# Patient Record
Sex: Female | Born: 1990 | Race: White | Hispanic: No | Marital: Single | Smoking: Current every day smoker
Health system: Southern US, Community
[De-identification: ages and names within clinical notes are randomized; demographics above are authoritative.]

## PROBLEM LIST (undated history)

## (undated) DIAGNOSIS — F32A Depression, unspecified: Secondary | ICD-10-CM

## (undated) DIAGNOSIS — F329 Major depressive disorder, single episode, unspecified: Secondary | ICD-10-CM

## (undated) DIAGNOSIS — R569 Unspecified convulsions: Secondary | ICD-10-CM

## (undated) DIAGNOSIS — F419 Anxiety disorder, unspecified: Secondary | ICD-10-CM

---
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
## 2010-05-04 ENCOUNTER — Inpatient Hospital Stay: Payer: Self-pay | Admitting: Obstetrics and Gynecology

## 2013-05-19 ENCOUNTER — Emergency Department: Payer: Self-pay | Admitting: Emergency Medicine

## 2013-05-19 LAB — URINALYSIS, COMPLETE
Bacteria: NONE SEEN
Blood: NEGATIVE
Ph: 8 (ref 4.5–8.0)
Protein: NEGATIVE
Squamous Epithelial: 3
WBC UR: 2 /HPF (ref 0–5)

## 2013-05-19 LAB — BASIC METABOLIC PANEL
Anion Gap: 9 (ref 7–16)
BUN: 8 mg/dL (ref 7–18)
Co2: 25 mmol/L (ref 21–32)
Creatinine: 0.61 mg/dL (ref 0.60–1.30)
Glucose: 83 mg/dL (ref 65–99)
Osmolality: 277 (ref 275–301)

## 2013-05-19 LAB — WET PREP, GENITAL

## 2013-05-19 LAB — CBC
MCH: 32.6 pg (ref 26.0–34.0)
MCHC: 34.6 g/dL (ref 32.0–36.0)
Platelet: 338 10*3/uL (ref 150–440)
RBC: 4.03 10*6/uL (ref 3.80–5.20)
RDW: 12.7 % (ref 11.5–14.5)
WBC: 15.4 10*3/uL — ABNORMAL HIGH (ref 3.6–11.0)

## 2013-05-19 LAB — GC/CHLAMYDIA PROBE AMP

## 2014-07-31 ENCOUNTER — Emergency Department: Payer: Self-pay | Admitting: Emergency Medicine

## 2014-08-01 LAB — COMPREHENSIVE METABOLIC PANEL
AST: 26 U/L (ref 15–37)
Albumin: 3.6 g/dL (ref 3.4–5.0)
Alkaline Phosphatase: 91 U/L
Anion Gap: 7 (ref 7–16)
BUN: 10 mg/dL (ref 7–18)
Bilirubin,Total: 0.4 mg/dL (ref 0.2–1.0)
CALCIUM: 9.3 mg/dL (ref 8.5–10.1)
CHLORIDE: 108 mmol/L — AB (ref 98–107)
CO2: 23 mmol/L (ref 21–32)
CREATININE: 0.61 mg/dL (ref 0.60–1.30)
EGFR (African American): >60
EGFR (Non-African Amer.): >60
Glucose: 73 mg/dL (ref 65–99)
Osmolality: 273 (ref 275–301)
Potassium: 4.3 mmol/L (ref 3.5–5.1)
SGPT (ALT): 22 U/L
Sodium: 138 mmol/L (ref 136–145)
TOTAL PROTEIN: 8.2 g/dL (ref 6.4–8.2)

## 2014-08-01 LAB — CBC
HCT: 43.4 % (ref 35.0–47.0)
HGB: 14.3 g/dL (ref 12.0–16.0)
MCH: 32 pg (ref 26.0–34.0)
MCHC: 33 g/dL (ref 32.0–36.0)
MCV: 97 fL (ref 80–100)
Platelet: 571 10*3/uL — ABNORMAL HIGH (ref 150–440)
RBC: 4.47 10*6/uL (ref 3.80–5.20)
RDW: 14.1 % (ref 11.5–14.5)
WBC: 14.9 10*3/uL — ABNORMAL HIGH (ref 3.6–11.0)

## 2015-03-16 ENCOUNTER — Encounter: Payer: Self-pay | Admitting: Emergency Medicine

## 2015-03-16 ENCOUNTER — Emergency Department
Admission: EM | Admit: 2015-03-16 | Discharge: 2015-03-16 | Disposition: A | Discharge status: Home / Self Care | Payer: Self-pay | Attending: Emergency Medicine | Admitting: Emergency Medicine

## 2015-03-16 DIAGNOSIS — Z880 Allergy status to penicillin: Secondary | ICD-10-CM | POA: Insufficient documentation

## 2015-03-16 DIAGNOSIS — Z720 Tobacco use: Secondary | ICD-10-CM | POA: Insufficient documentation

## 2015-03-16 DIAGNOSIS — N946 Dysmenorrhea, unspecified: Secondary | ICD-10-CM | POA: Insufficient documentation

## 2015-03-16 DIAGNOSIS — Z3202 Encounter for pregnancy test, result negative: Secondary | ICD-10-CM | POA: Insufficient documentation

## 2015-03-16 LAB — POCT PREGNANCY, URINE: Preg Test, Ur: NEGATIVE

## 2015-03-16 LAB — WET PREP, GENITAL
Clue Cells Wet Prep HPF POC: NEGATIVE — AB
TRICH WET PREP: NEGATIVE — AB
YEAST WET PREP: NEGATIVE — AB

## 2015-03-16 LAB — CHLAMYDIA/NGC RT PCR (ARMC ONLY)
Chlamydia Tr: NOT DETECTED
N gonorrhoeae: NOT DETECTED

## 2015-03-16 NOTE — Discharge Instructions (Signed)
Return to the emergency department for any new or worsening vaginal bleeding, dizziness, passing out, abdominal pain, or fever.  Dysmenorrhea Menstrual cramps (dysmenorrhea) are caused by the muscles of the uterus tightening (contracting) during a menstrual period. For some women, this discomfort is merely bothersome. For others, dysmenorrhea can be severe enough to interfere with everyday activities for a few days each month. Primary dysmenorrhea is menstrual cramps that last a couple of days when you start having menstrual periods or soon after. This often begins after a teenager starts having her period. As a woman gets older or has a baby, the cramps will usually lessen or disappear. Secondary dysmenorrhea begins later in life, lasts longer, and the pain may be stronger than primary dysmenorrhea. The pain may start before the period and last a few days after the period.  CAUSES  Dysmenorrhea is usually caused by an underlying problem, such as:  The tissue lining the uterus grows outside of the uterus in other areas of the body (endometriosis).  The endometrial tissue, which normally lines the uterus, is found in or grows into the muscular walls of the uterus (adenomyosis).  The pelvic blood vessels are engorged with blood just before the menstrual period (pelvic congestive syndrome).  Overgrowth of cells (polyps) in the lining of the uterus or cervix.  Falling down of the uterus (prolapse) because of loose or stretched ligaments.  Depression.  Bladder problems, infection, or inflammation.  Problems with the intestine, a tumor, or irritable bowel syndrome.  Cancer of the female organs or bladder.  A severely tipped uterus.  A very tight opening or closed cervix.  Noncancerous tumors of the uterus (fibroids).  Pelvic inflammatory disease (PID).  Pelvic scarring (adhesions) from a previous surgery.  Ovarian cyst.  An intrauterine device (IUD) used for birth control. RISK  FACTORS You may be at greater risk of dysmenorrhea if:  You are younger than age 24.  You started puberty early.  You have irregular or heavy bleeding.  You have never given birth.  You have a family history of this problem.  You are a smoker. SIGNS AND SYMPTOMS   Cramping or throbbing pain in your lower abdomen.  Headaches.  Lower back pain.  Nausea or vomiting.  Diarrhea.  Sweating or dizziness.  Loose stools. DIAGNOSIS  A diagnosis is based on your history, symptoms, physical exam, diagnostic tests, or procedures. Diagnostic tests or procedures may include:  Blood tests.  Ultrasonography.  An examination of the lining of the uterus (dilation and curettage, D&C).  An examination inside your abdomen or pelvis with a scope (laparoscopy).  X-rays.  CT scan.  MRI.  An examination inside the bladder with a scope (cystoscopy).  An examination inside the intestine or stomach with a scope (colonoscopy, gastroscopy). TREATMENT  Treatment depends on the cause of the dysmenorrhea. Treatment may include:  Pain medicine prescribed by your health care provider.  Birth control pills or an IUD with progesterone hormone in it.  Hormone replacement therapy.  Nonsteroidal anti-inflammatory drugs (NSAIDs). These may help stop the production of prostaglandins.  Surgery to remove adhesions, endometriosis, ovarian cyst, or fibroids.  Removal of the uterus (hysterectomy).  Progesterone shots to stop the menstrual period.  Cutting the nerves on the sacrum that go to the female organs (presacral neurectomy).  Electric current to the sacral nerves (sacral nerve stimulation).  Antidepressant medicine.  Psychiatric therapy, counseling, or group therapy.  Exercise and physical therapy.  Meditation and yoga therapy.  Acupuncture. HOME CARE INSTRUCTIONS  Only take over-the-counter or prescription medicines as directed by your health care provider.  Place a  heating pad or hot water bottle on your lower back or abdomen. Do not sleep with the heating pad.  Use aerobic exercises, walking, swimming, biking, and other exercises to help lessen the cramping.  Massage to the lower back or abdomen may help.  Stop smoking.  Avoid alcohol and caffeine. SEEK MEDICAL CARE IF:   Your pain does not get better with medicine.  You have pain with sexual intercourse.  Your pain increases and is not controlled with medicines.  You have abnormal vaginal bleeding with your period.  You develop nausea or vomiting with your period that is not controlled with medicine. SEEK IMMEDIATE MEDICAL CARE IF:  You pass out.  Document Released: 10/22/2005 Document Revised: 06/24/2013 Document Reviewed: 04/09/2013 [facility] Patient Information 2015 [place], [place]. This information is not intended to replace advice given to you by your health care provider. Make sure you discuss any questions you have with your health care provider.

## 2015-03-16 NOTE — ED Provider Notes (Signed)
[facility] Emergency Department Provider Note   ____________________________________________  Time seen: 1:15 PM  I have reviewed the triage vital signs and the nursing notes.   HISTORY  Chief Complaint Vaginal Bleeding    HPI Dana Bradshaw is a 24 y.o. female who came for evaluation for vaginal bleeding for a few days. Yesterday it was pretty heavy and she passed a large clot. She had been 3 weeks late for her period raising possibility of pregnancy. Patient is also concerned a possible STD given the fact that she feels like she has a discharge with an odor and she knows that her boyfriend cheated on her. She has been treated for STD in the past this was "a long time ago. "She is not not having active vaginal bleeding today. She does not have dizziness or lightheadedness. She has not had any passing out. She has not had any abdominal pain.      History reviewed. No pertinent past medical history.  There are no active problems to display for this patient.   History reviewed. No pertinent past surgical history.  No current outpatient prescriptions on file.  Allergies Penicillins  No family history on file.  Social History History  Substance Use Topics  . Smoking status: Current Every Day Smoker  . Smokeless tobacco: Not on file  . Alcohol Use: Yes    Review of Systems  Constitutional: Negative for fever. Eyes: Negative for visual changes. ENT: Positive for seasonal allergies Cardiovascular: Negative for chest pain. Respiratory: Negative for shortness of breath. Gastrointestinal: Negative for abdominal pain, vomiting and diarrhea. Genitourinary: Negative for dysuria. Musculoskeletal: Negative for back pain. Skin: Negative for rash. Neurological: Negative for headaches, focal weakness or numbness.   10-point ROS otherwise negative.  ____________________________________________   PHYSICAL EXAM:  VITAL SIGNS: ED Triage Vitals   Enc Vitals Group     BP 03/16/15 1002 131/73 mmHg     Pulse Rate 03/16/15 1002 90     Resp 03/16/15 1002 18     Temp 03/16/15 1002 98.3 F (36.8 C)     Temp Source 03/16/15 1002 Oral     SpO2 03/16/15 1002 100 %     Weight 03/16/15 1002 132 lb (59.875 kg)     Height 03/16/15 1002 5\' 2"  (1.575 m)     Head Cir --      Peak Flow --      Pain Score 03/16/15 1003 5     Pain Loc --      Pain Edu? --      Excl. in GC? --      Constitutional: Alert and oriented. Well appearing and in no distress. Eyes: Conjunctivae are normal. Disconjugate gaze. Normal extraocular movements. ENT   Head: Normocephalic and atraumatic.   Nose: No congestion/rhinnorhea.   Mouth/Throat: Mucous membranes are moist.   Neck: No stridor. Cardiovascular: Normal rate, regular rhythm.  No murmurs, rubs, or gallops. Respiratory: Normal respiratory effort without tachypnea nor retractions. Breath sounds are clear and equal bilaterally. No wheezes/rales/rhonchi. Gastrointestinal: Soft and nontender. No distention.  Genitourinary: No obvious vaginal discharge. No vaginal bleeding. Nontender uterus and adnexa. No cervical motion tenderness Musculoskeletal: Nontender with normal range of motion in all extremities. No joint effusions.  No lower extremity tenderness nor edema. Neurologic:  Normal speech and language. No gross focal neurologic deficits are appreciated. Speech is normal. No gait instability. Skin:  Skin is warm, dry and intact. No rash noted. Psychiatric: Mood and affect are normal. Speech  and behavior are normal. Patient exhibits appropriate insight and judgment.  ____________________________________________   EKG    ____________________________________________   LABS (pertinent positives/negatives) Pregnancy test negative   ____________________________________________    RADIOLOGY    ____________________________________________   PROCEDURES  Procedure(s) performed:  None Critical Care performed:  No  ____________________________________________   INITIAL IMPRESSION / ASSESSMENT AND PLAN / ED COURSE  Pertinent labs & imaging results that were available during my care of the patient were reviewed by me and considered in my medical decision making (see chart for details).  I informed patient that her pregnancy test is negative. She is requesting to be checked for STD although she does not have any significant symptoms on exam and no cervical motion tenderness. She denies any dizziness or abdominal cramping. I do not see the utility in any laboratory blood work at this point time. She does follow with OB/GYN. We discussed that we did not do a Pap smear and she needs to continue to have her routine pelvic exam with OB/GYN. Current Chlamydia test were not back prior to discharge. Patient will be called only if positive. Return precautions and discharge instructions were provided to the patient.  ____________________________________________   FINAL CLINICAL IMPRESSION(S) / ED DIAGNOSES  dysmenorrhea    Governor Rooksebecca Zolton Dowson, MD 03/17/15 1539

## 2015-03-16 NOTE — ED Notes (Signed)
Pt states "i think i was pregnant bc my period was 3 weeks late but yesterday passed a big blood clot"  Light bleeding now. Also states "i want to be checked for STDs"

## 2015-03-16 NOTE — ED Notes (Signed)
Pt here d/t having abd pain earlier and vaginal bleeding.  Pt denies any pain currently.  Pt does state she passes a "large clot" earlier.

## 2015-03-16 NOTE — ED Notes (Signed)
Patient is resting comfortably. 

## 2016-08-22 ENCOUNTER — Encounter: Payer: Self-pay | Admitting: Medical Oncology

## 2016-08-22 ENCOUNTER — Emergency Department
Admission: EM | Admit: 2016-08-22 | Discharge: 2016-08-23 | Disposition: A | Discharge status: Home / Self Care | Payer: No Typology Code available for payment source | Attending: Student in an Organized Health Care Education/Training Program | Admitting: Student in an Organized Health Care Education/Training Program

## 2016-08-22 ENCOUNTER — Emergency Department
Admission: EM | Admit: 2016-08-22 | Discharge: 2016-08-22 | Disposition: A | Discharge status: Home / Self Care | Payer: Self-pay | Attending: Emergency Medicine | Admitting: Emergency Medicine

## 2016-08-22 ENCOUNTER — Emergency Department: Payer: No Typology Code available for payment source

## 2016-08-22 DIAGNOSIS — R4182 Altered mental status, unspecified: Secondary | ICD-10-CM | POA: Insufficient documentation

## 2016-08-22 DIAGNOSIS — F17200 Nicotine dependence, unspecified, uncomplicated: Secondary | ICD-10-CM | POA: Insufficient documentation

## 2016-08-22 DIAGNOSIS — Z79899 Other long term (current) drug therapy: Secondary | ICD-10-CM | POA: Insufficient documentation

## 2016-08-22 DIAGNOSIS — F15950 Other stimulant use, unspecified with stimulant-induced psychotic disorder with delusions: Secondary | ICD-10-CM

## 2016-08-22 DIAGNOSIS — F319 Bipolar disorder, unspecified: Secondary | ICD-10-CM | POA: Insufficient documentation

## 2016-08-22 DIAGNOSIS — F1910 Other psychoactive substance abuse, uncomplicated: Secondary | ICD-10-CM

## 2016-08-22 DIAGNOSIS — N3090 Cystitis, unspecified without hematuria: Secondary | ICD-10-CM | POA: Insufficient documentation

## 2016-08-22 DIAGNOSIS — R4689 Other symptoms and signs involving appearance and behavior: Secondary | ICD-10-CM

## 2016-08-22 DIAGNOSIS — R000 Tachycardia, unspecified: Secondary | ICD-10-CM

## 2016-08-22 DIAGNOSIS — F23 Brief psychotic disorder: Secondary | ICD-10-CM

## 2016-08-22 DIAGNOSIS — F1920 Other psychoactive substance dependence, uncomplicated: Secondary | ICD-10-CM

## 2016-08-22 HISTORY — DX: Major depressive disorder, single episode, unspecified: F32.9

## 2016-08-22 HISTORY — DX: Depression, unspecified: F32.A

## 2016-08-22 HISTORY — DX: Anxiety disorder, unspecified: F41.9

## 2016-08-22 HISTORY — DX: Unspecified convulsions: R56.9

## 2016-08-22 LAB — CBC WITH DIFFERENTIAL/PLATELET
BASOS ABS: 0.1 10*3/uL (ref 0–0.1)
BASOS PCT: 1 %
Basophils Absolute: 0.2 10*3/uL — ABNORMAL HIGH (ref 0–0.1)
Basophils Relative: 0 %
EOS PCT: 1 %
Eosinophils Absolute: 0.1 10*3/uL (ref 0–0.7)
Eosinophils Absolute: 0.1 10*3/uL (ref 0–0.7)
Eosinophils Relative: 0 %
HCT: 42.7 % (ref 35.0–47.0)
HEMATOCRIT: 36.3 % (ref 35.0–47.0)
HEMOGLOBIN: 12.5 g/dL (ref 12.0–16.0)
Hemoglobin: 14.5 g/dL (ref 12.0–16.0)
LYMPHS ABS: 3.6 10*3/uL (ref 1.0–3.6)
LYMPHS ABS: 2.9 10*3/uL (ref 1.0–3.6)
Lymphocytes Relative: 17 %
Lymphocytes Relative: 19 %
MCH: 32.9 pg (ref 26.0–34.0)
MCH: 33.6 pg (ref 26.0–34.0)
MCHC: 34 g/dL (ref 32.0–36.0)
MCHC: 34.5 g/dL (ref 32.0–36.0)
MCV: 96.7 fL (ref 80.0–100.0)
MCV: 97.4 fL (ref 80.0–100.0)
MONOS PCT: 8 %
Monocytes Absolute: 2 10*3/uL — ABNORMAL HIGH (ref 0.2–0.9)
Monocytes Absolute: 1.2 10*3/uL — ABNORMAL HIGH (ref 0.2–0.9)
Monocytes Relative: 9 %
NEUTROS ABS: 10.9 10*3/uL — AB (ref 1.4–6.5)
NEUTROS PCT: 72 %
Neutro Abs: 15.4 10*3/uL — ABNORMAL HIGH (ref 1.4–6.5)
Neutrophils Relative %: 73 %
PLATELETS: 402 10*3/uL (ref 150–440)
Platelets: 339 10*3/uL (ref 150–440)
RBC: 4.42 MIL/uL (ref 3.80–5.20)
RBC: 3.73 MIL/uL — AB (ref 3.80–5.20)
RDW: 13.3 % (ref 11.5–14.5)
RDW: 13.2 % (ref 11.5–14.5)
WBC: 21.2 10*3/uL — ABNORMAL HIGH (ref 3.6–11.0)
WBC: 15.2 10*3/uL — ABNORMAL HIGH (ref 3.6–11.0)

## 2016-08-22 LAB — COMPREHENSIVE METABOLIC PANEL
ALBUMIN: 3.6 g/dL (ref 3.5–5.0)
ALK PHOS: 54 U/L (ref 38–126)
ALT: 18 U/L (ref 14–54)
ALT: 18 U/L (ref 14–54)
ANION GAP: 7 (ref 5–15)
AST: 31 U/L (ref 15–41)
AST: 37 U/L (ref 15–41)
Albumin: 4.3 g/dL (ref 3.5–5.0)
Alkaline Phosphatase: 70 U/L (ref 38–126)
Anion gap: 10 (ref 5–15)
BILIRUBIN TOTAL: 1.5 mg/dL — AB (ref 0.3–1.2)
BUN: 14 mg/dL (ref 6–20)
BUN: 14 mg/dL (ref 6–20)
CALCIUM: 8.6 mg/dL — AB (ref 8.9–10.3)
CO2: 24 mmol/L (ref 22–32)
CO2: 23 mmol/L (ref 22–32)
CREATININE: 0.8 mg/dL (ref 0.44–1.00)
Calcium: 9.6 mg/dL (ref 8.9–10.3)
Chloride: 107 mmol/L (ref 101–111)
Chloride: 112 mmol/L — ABNORMAL HIGH (ref 101–111)
Creatinine, Ser: 1.06 mg/dL — ABNORMAL HIGH (ref 0.44–1.00)
GFR calc Af Amer: >60 mL/min (ref >60)
GFR calc Af Amer: >60 mL/min (ref >60)
GFR calc non Af Amer: >60 mL/min (ref >60)
GFR calc non Af Amer: >60 mL/min (ref >60)
GLUCOSE: 78 mg/dL (ref 65–99)
Glucose, Bld: 97 mg/dL (ref 65–99)
POTASSIUM: 3.9 mmol/L (ref 3.5–5.1)
Potassium: 3.5 mmol/L (ref 3.5–5.1)
Sodium: 141 mmol/L (ref 135–145)
Sodium: 142 mmol/L (ref 135–145)
TOTAL PROTEIN: 7.9 g/dL (ref 6.5–8.1)
TOTAL PROTEIN: 6.5 g/dL (ref 6.5–8.1)
Total Bilirubin: 1.5 mg/dL — ABNORMAL HIGH (ref 0.3–1.2)

## 2016-08-22 LAB — URINE DRUG SCREEN, QUALITATIVE (ARMC ONLY)
AMPHETAMINES, UR SCREEN: POSITIVE — AB
BENZODIAZEPINE, UR SCRN: NOT DETECTED
Barbiturates, Ur Screen: NOT DETECTED
COCAINE METABOLITE, UR ~~LOC~~: POSITIVE — AB
Cannabinoid 50 Ng, Ur ~~LOC~~: POSITIVE — AB
MDMA (Ecstasy)Ur Screen: NOT DETECTED
METHADONE SCREEN, URINE: NOT DETECTED
Opiate, Ur Screen: NOT DETECTED
Phencyclidine (PCP) Ur S: NOT DETECTED
TRICYCLIC, UR SCREEN: NOT DETECTED

## 2016-08-22 LAB — URINALYSIS COMPLETE WITH MICROSCOPIC (ARMC ONLY)
Bilirubin Urine: NEGATIVE
GLUCOSE, UA: NEGATIVE mg/dL
Nitrite: NEGATIVE
Protein, ur: 100 mg/dL — AB
Specific Gravity, Urine: 1.029 (ref 1.005–1.030)
pH: 5 (ref 5.0–8.0)

## 2016-08-22 LAB — TSH: TSH: 1.332 u[IU]/mL (ref 0.350–4.500)

## 2016-08-22 LAB — ETHANOL: Alcohol, Ethyl (B): <5 mg/dL (ref <5)

## 2016-08-22 LAB — POCT PREGNANCY, URINE: Preg Test, Ur: NEGATIVE

## 2016-08-22 LAB — SALICYLATE LEVEL

## 2016-08-22 LAB — ACETAMINOPHEN LEVEL: Acetaminophen (Tylenol), Serum: <10 ug/mL — ABNORMAL LOW (ref 10–30)

## 2016-08-22 LAB — LIPASE, BLOOD: Lipase: 17 U/L (ref 11–51)

## 2016-08-22 MED ORDER — DIPHENHYDRAMINE HCL 50 MG/ML IJ SOLN
25.0000 mg | Freq: Once | INTRAMUSCULAR | Status: AC
  Administered 2016-08-22: 25 mg via INTRAVENOUS
  Filled 2016-08-22: qty 1

## 2016-08-22 MED ORDER — CEFTRIAXONE SODIUM-DEXTROSE 1-3.74 GM-% IV SOLR: 1.0000 g | Freq: Once | INTRAVENOUS | Status: DC

## 2016-08-22 MED ORDER — LORAZEPAM 2 MG/ML IJ SOLN
1.0000 mg | Freq: Once | INTRAMUSCULAR | Status: AC
  Administered 2016-08-22: 1 mg via INTRAVENOUS

## 2016-08-22 MED ORDER — CIPROFLOXACIN HCL 500 MG PO TABS
500.0000 mg | ORAL_TABLET | ORAL | Status: AC
  Administered 2016-08-22: 500 mg via ORAL
  Filled 2016-08-22: qty 1

## 2016-08-22 MED ORDER — LORAZEPAM 2 MG/ML IJ SOLN
1.0000 mg | Freq: Once | INTRAMUSCULAR | Status: AC
  Administered 2016-08-22: 1 mg via INTRAVENOUS
  Filled 2016-08-22: qty 1

## 2016-08-22 MED ORDER — DEXTROSE 5 % IV SOLN: 1.0000 g | Freq: Once | INTRAVENOUS | Status: DC

## 2016-08-22 MED ORDER — CIPROFLOXACIN HCL 500 MG PO TABS: 500.0000 mg | ORAL_TABLET | Freq: Two times a day (BID) | ORAL | 0 refills | Status: DC

## 2016-08-22 MED ORDER — CEFTRIAXONE SODIUM-DEXTROSE 1-3.74 GM-% IV SOLR
1.0000 g | Freq: Once | INTRAVENOUS | Status: AC
  Administered 2016-08-22: 1 g via INTRAVENOUS
  Filled 2016-08-22: qty 50

## 2016-08-22 MED ORDER — ZIPRASIDONE MESYLATE 20 MG IM SOLR
20.0000 mg | Freq: Once | INTRAMUSCULAR | Status: AC
  Administered 2016-08-22: 20 mg via INTRAMUSCULAR

## 2016-08-22 MED ORDER — SODIUM CHLORIDE 0.9 % IV BOLUS (SEPSIS)
1000.0000 mL | Freq: Once | INTRAVENOUS | Status: AC
  Administered 2016-08-22: 1000 mL via INTRAVENOUS

## 2016-08-22 MED ORDER — DIPHENHYDRAMINE HCL 50 MG/ML IJ SOLN
50.0000 mg | Freq: Once | INTRAMUSCULAR | Status: AC
  Administered 2016-08-22: 50 mg via INTRAVENOUS
  Filled 2016-08-22: qty 1

## 2016-08-22 MED ORDER — LORAZEPAM 2 MG/ML IJ SOLN
2.0000 mg | Freq: Once | INTRAMUSCULAR | Status: AC
  Administered 2016-08-22: 2 mg via INTRAMUSCULAR

## 2016-08-22 NOTE — ED Notes (Signed)
Pt speaking with mother on the phone.
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
## 2016-08-22 NOTE — ED Notes (Signed)
BEHAVIORAL HEALTH ROUNDING Patient sleeping: No. Patient alert and oriented: yes Behavior appropriate: Yes.  ; If no, describe:  Nutrition and fluids offered: yes Toileting and hygiene offered: Yes  Sitter present: q15 minute observations and security  monitoring Law enforcement present: Yes  ODS  

## 2016-08-22 NOTE — ED Notes (Signed)
PT IVC PENDING CONSULT  
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
## 2016-08-22 NOTE — ED Notes (Signed)
Pt continues to have dystonic movement .. MD aware.. Will continue to monitor the pt.
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
## 2016-08-22 NOTE — Progress Notes (Signed)
Please contact mother Luster LandsbergRenee at [phone] with any updates or treatment recommendations once pt. Has been seen by Psych MD. Mother is has P.O.A.  Sheilia Reznick K. Sherlon HandingHarris, LCAS-A, LPC-A, [facility]  Counselor 08/22/2016 5:06 PM
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
## 2016-08-22 NOTE — ED Notes (Signed)
BEHAVIORAL HEALTH ROUNDING Patient sleeping: No. Patient alert and oriented: yes Behavior appropriate: Yes.  ; If no, describe:  Nutrition and fluids offered: yes Toileting and hygiene offered: Yes  Sitter present: q15 minute observations and security monitoring Law enforcement present: Yes  ODS  ENVIRONMENTAL ASSESSMENT Potentially harmful objects out of patient reach: Yes.   Personal belongings secured: Yes.   Patient dressed in hospital provided attire only: Yes.   Plastic bags out of patient reach: Yes.   Patient care equipment (cords, cables, call bells, lines, and drains) shortened, removed, or accounted for: Yes.  Pt is on monitor  Cords will be monitored closely   Equipment and supplies removed from bottom of stretcher: Yes.   Potentially toxic materials out of patient reach: Yes.   Sharps container removed or out of patient reach: Yes.

## 2016-08-22 NOTE — ED Notes (Signed)
Pt rest of pt clothing (pants/socks/boots) removed, pt attempted to hide a crack pipe when removing her boots.. MD notified and crack pipe given to the [employer] officer..Marland Kitchen

## 2016-08-22 NOTE — ED Notes (Signed)
Called pt mother Luster LandsbergRenee at [phone] so that pt could speak with her, pt then hands the phone to me and says to tell her mother what is going on .Marland Kitchen. Informed mother of pt current status, mother states she is POA and is going to fax over the paper work.

## 2016-08-22 NOTE — ED Notes (Signed)
Pt has pulled off pulse Ox, and has started to pull gauze wrap off from Iv site.. Pt reminded to not pull on her IV..will continue to monitor the pt.
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
## 2016-08-23 DIAGNOSIS — F15950 Other stimulant use, unspecified with stimulant-induced psychotic disorder with delusions: Secondary | ICD-10-CM

## 2016-08-23 DIAGNOSIS — F1920 Other psychoactive substance dependence, uncomplicated: Secondary | ICD-10-CM

## 2016-08-23 LAB — URINE CULTURE

## 2016-08-23 MED ORDER — DIPHENHYDRAMINE HCL 12.5 MG/5ML PO ELIX
25.0000 mg | ORAL_SOLUTION | Freq: Once | ORAL | Status: AC
  Administered 2016-08-23: 25 mg via ORAL

## 2016-08-23 MED ORDER — METHYLPREDNISOLONE SODIUM SUCC 125 MG IJ SOLR
125.0000 mg | Freq: Once | INTRAMUSCULAR | Status: AC
  Administered 2016-08-23: 125 mg via INTRAVENOUS

## 2016-08-23 MED ORDER — DIPHENHYDRAMINE HCL 50 MG/ML IJ SOLN
INTRAMUSCULAR | Status: AC
  Filled 2016-08-23: qty 1

## 2016-08-23 MED ORDER — METHYLPREDNISOLONE SODIUM SUCC 125 MG IJ SOLR
INTRAMUSCULAR | Status: AC
  Administered 2016-08-23: 125 mg via INTRAVENOUS
  Filled 2016-08-23: qty 2

## 2016-08-23 NOTE — ED Notes (Signed)
Pt taken off the monitor to allow her to go to the bathroom. Pt is noted to be calm and cooperative at this time. Will continue to monitor for further patient needs.

## 2016-08-23 NOTE — ED Notes (Signed)
This RN escorted patient to lobby to meet her father. Pt states at that point, "Well I will probably go smoke a cigarette and then turn around and come right back". First RN notified, charge RN notified.

## 2016-08-23 NOTE — ED Notes (Signed)
PT given phone for phone call from her mom at this time.

## 2016-08-23 NOTE — ED Notes (Signed)
Pts mother/POA called and requested to talk w/ Clappacs regarding pts discharge.  Pts mother was able to give pts password.  Pts mother stated that she did not believe pt should be discharged.  While pts mother was talking w/ this RN, Clappacs paged.  He answered page and spoke w/ this RN, while pts mother was placed on hold.  He reported that pt had asked that mother/POA not to be consulted regarding pt care plan.  This RN confirmed request w/ pt.  Pt stated that she did not want Clappacs to speak w/ POA/mother.  This RN informed pts mother/POA and offered to take phone number if pt changed her mind.  Pts mother declined to leave phone number, stated "but you can tell DR. Clappacs that she's going to be dead within a week, I guarantee it".   This RN informed [name], pts RN.

## 2016-08-23 NOTE — ED Notes (Signed)
Report received from Butch, RN 
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
## 2016-08-23 NOTE — ED Notes (Signed)
Dr. Clapacs at bedside at this time.  

## 2016-08-23 NOTE — Consult Note (Signed)
[facility] Consult   Reason for Consult:  Consult for 25 year old woman who was brought into the emergency room yesterday extremely agitated confused bizarre and aggressive behavior. Referring Physician:  Corky Downs Patient Identification: Dana Bradshaw MRN:  [id number] Principal Diagnosis: Amphetamine and psychostimulant-induced psychosis with delusions [facility]) Diagnosis:   Patient Active Problem List   Diagnosis Date Noted  . Amphetamine and psychostimulant-induced psychosis with delusions ([place]) [F15.950] 08/23/2016  . Polysubstance dependence [facility]) [F19.20] 08/23/2016    Total Time spent with patient: 1 hour  Subjective:   Dana Bradshaw is a 25 y.o. female patient admitted with "I got thrown out of an [facility] when I relapsed".  HPI:  Patient came to the emergency room yesterday morning initially complaining of some physical symptoms. She also identified substance abuse issues. She was to be discharged from the emergency room when apparently she became acutely agitated. She was caught at one point trying to pull syringes out of a sharps box. Became violent and aggressive with staff. Had to be restrained to prevent her from harming herself or others. Labs show a drug screen positive for multiple substances including amphetamines and cocaine. Alcohol screen was negative. Patient was asleep all afternoon after being given medicines for safety sedation. On evaluation today she tells me that she relapsed by drinking about 2 or 3 days ago and was expelled from the [facility] she was staying in in [place]. After that she thinks she just kept on drinking and went into a blackout. Was probably blacked out for 12-24 hours or so. When she came to she was in a motel room in [place] with track marks on her arm. Does not recall using any drugs except for alcohol and marijuana during that time. Currently the patient says she is feeling much better. Denies being depressed. Denies  suicidal or homicidal thoughts. Denies any hallucinations currently or psychosis. Patient states that she had been going to [facility] and working at staying sober in [place]. She wants to get back into her plan to stay sober as an outpatient. She says she was taking citalopram and Vistaril when necessary which were prescribed by [facility].  Social history: Had been staying at an [facility] in [place] for about a month. She has had inpatient substance abuse treatment in [place] and at [facility]. Mother is her healthcare power of attorney. Parents were here yesterday trying to assist with her care.  Medical history: Patient denies any significant medical problems for which she is receiving active treatment. Does not know of any history of hepatitis.  Substance abuse history: Substance abuse problem since at least age 51. Long history of abuse of opiates including heroin abuse and multiple other drugs including alcohol. Longest sobriety by her report was about 6 months. Was staying in an [facility] in [place] trying to stay sober recently. She does not think she's ever had delirium tremens. She thinks she has had a seizure in the past when she was coming off of benzodiazepines and alcohol.  Past Psychiatric History: She has had inpatient substance abuse treatment and has been treated with an antidepressant. Says that she's never been told she had bipolar disorder or psychotic disorder. Denies ever having had psychiatric treatment separate from substance abuse treatment. Denies any history of psychotic symptoms. She says she had 1 suicide attempt when she was in middle school and none since then.  Risk to Self: Suicidal Ideation: Yes-Currently Present Suicidal Intent: No (  unknown, UTA) Is patient at risk for suicide?: Yes Suicidal Plan?: No Access to Means: No What has been your use of drugs/alcohol within the last 12 months?: alcohol and other How many times?: 1  (UTA actual) Other Self Harm Risks: none Triggers for Past Attempts: Unpredictable Intentional Self Injurious Behavior: None (UTA) Risk to Others: Homicidal Ideation: No Thoughts of Harm to Others: No Current Homicidal Intent: No Current Homicidal Plan: No Access to Homicidal Means: No Identified Victim: none History of harm to others?: No Assessment of Violence: None Noted Violent Behavior Description: n/a Does patient have access to weapons?: No Criminal Charges Pending?: No Does patient have a court date: Yes Court Date: 09/06/16 (November 27th [place] related) Prior Inpatient Therapy: Prior Inpatient Therapy: No Prior Therapy Dates: n/a Prior Therapy Facilty/Provider(s): n/a Reason for Treatment: n/a Prior Outpatient Therapy: Prior Outpatient Therapy: Yes Prior Therapy Dates: past weeks Prior Therapy Facilty/Provider(s): [facility] Reason for Treatment: [facility] Does patient have an ACCT team?: No Does patient have Intensive In-House Services?  : No Does patient have [facility] services? : No Does patient have P4CC services?: No  Past Medical History:  Past Medical History:  Diagnosis Date  . Anxiety   . Depression   . Seizures ([place])    No past surgical history on file. Family History: No family history on file. Family Psychiatric  History: She says that her mother has had problems with depression and that her father is "crazy" Social History:  History  Alcohol Use  . Yes     History  Drug use: Unknown    Social History   Social History  . Marital status: Single    Spouse name: N/A  . Number of children: N/A  . Years of education: N/A   Social History Main Topics  . Smoking status: Current Every Day Smoker  . Smokeless tobacco: Not on file  . Alcohol use Yes  . Drug use: Unknown  . Sexual activity: Not on file   Other Topics Concern  . Not on file   Social History Narrative  . No narrative on file   Additional Social History:    Allergies:     Allergies  Allergen Reactions  . Penicillins Other (See Comments)    Told as a child not to take it    Labs:  Results for orders placed or performed during the hospital encounter of 08/22/16 (from the past 48 hour(s))  CBC with Differential/Platelet     Status: Abnormal   Collection Time: 08/22/16  2:58 PM  Result Value Ref Range   WBC 15.2 (H) 3.6 - 11.0 K/uL   RBC 3.73 (L) 3.80 - 5.20 MIL/uL   Hemoglobin 12.5 12.0 - 16.0 g/dL   HCT 36.3 35.0 - 47.0 %   MCV 97.4 80.0 - 100.0 fL   MCH 33.6 26.0 - 34.0 pg   MCHC 34.5 32.0 - 36.0 g/dL   RDW 13.2 11.5 - 14.5 %   Platelets 339 150 - 440 K/uL   Neutrophils Relative % 72 %   Neutro Abs 10.9 (H) 1.4 - 6.5 K/uL   Lymphocytes Relative 19 %   Lymphs Abs 2.9 1.0 - 3.6 K/uL   Monocytes Relative 8 %   Monocytes Absolute 1.2 (H) 0.2 - 0.9 K/uL   Eosinophils Relative 0 %   Eosinophils Absolute 0.1 0 - 0.7 K/uL   Basophils Relative 1 %   Basophils Absolute 0.2 (H) 0 - 0.1 K/uL  Comprehensive metabolic panel  Status: Abnormal   Collection Time: 08/22/16  2:58 PM  Result Value Ref Range   Sodium 142 135 - 145 mmol/L   Potassium 3.5 3.5 - 5.1 mmol/L   Chloride 112 (H) 101 - 111 mmol/L   CO2 23 22 - 32 mmol/L   Glucose, Bld 78 65 - 99 mg/dL   BUN 14 6 - 20 mg/dL   Creatinine, Ser 0.80 0.44 - 1.00 mg/dL   Calcium 8.6 (L) 8.9 - 10.3 mg/dL   Total Protein 6.5 6.5 - 8.1 g/dL   Albumin 3.6 3.5 - 5.0 g/dL   AST 37 15 - 41 U/L   ALT 18 14 - 54 U/L   Alkaline Phosphatase 54 38 - 126 U/L   Total Bilirubin 1.5 (H) 0.3 - 1.2 mg/dL   GFR calc non Af Amer >60 >60 mL/min   GFR calc Af Amer >60 >60 mL/min    Comment: (NOTE) The eGFR has been calculated using the CKD EPI equation. This calculation has not been validated in all clinical situations. eGFR's persistently <60 mL/min signify possible Chronic Kidney Disease.    Anion gap 7 5 - 15  TSH     Status: None   Collection Time: 08/22/16  2:58 PM  Result Value Ref Range   TSH 1.332 0.350  - 4.500 uIU/mL    Comment: Performed by a 3rd Generation assay with a functional sensitivity of <=0.01 uIU/mL.    No current facility-administered medications for this encounter.    Current Outpatient Prescriptions  Medication Sig Dispense Refill  . citalopram (CELEXA) 20 MG tablet Take 20 mg by mouth daily.    . hydrOXYzine (ATARAX/VISTARIL) 25 MG tablet Take 25 mg by mouth 4 (four) times daily as needed.    . ciprofloxacin (CIPRO) 500 MG tablet Take 1 tablet (500 mg total) by mouth 2 (two) times daily. 14 tablet 0    Musculoskeletal: Strength & Muscle Tone: within normal limits Gait & Station: normal Patient leans: N/A  Psychiatric Specialty Exam: Physical Exam  Nursing note and vitals reviewed. Constitutional: She appears well-developed and well-nourished.  HENT:  Head: Normocephalic and atraumatic.  Eyes: Conjunctivae are normal. Pupils are equal, round, and reactive to light.    Neck: Normal range of motion.  Cardiovascular: Regular rhythm and normal heart sounds.   Respiratory: Effort normal and breath sounds normal.  GI: Soft.  Musculoskeletal: Normal range of motion.  Neurological: She is alert.  Skin: Skin is warm and dry.     Psychiatric: Judgment normal. Her affect is blunt. Her speech is delayed. She is slowed. Thought content is not paranoid. She expresses no homicidal and no suicidal ideation. She exhibits abnormal recent memory.    Review of Systems  Constitutional: Positive for malaise/fatigue.  HENT: Negative.   Eyes: Negative.   Respiratory: Negative.   Cardiovascular: Negative.   Gastrointestinal: Negative.   Musculoskeletal: Negative.   Skin: Negative.   Neurological: Negative.   Psychiatric/Behavioral: Positive for memory loss and substance abuse. Negative for depression, hallucinations and suicidal ideas. The patient is nervous/anxious. The patient does not have insomnia.     Blood pressure 116/89, pulse 98, temperature 97.7 F (36.5 C), resp.  rate 14, last menstrual period 08/20/2016, SpO2 98 %.There is no height or weight on file to calculate BMI.  General Appearance: Disheveled  Eye Contact:  Fair  Speech:  Blocked  Volume:  Decreased  Mood:  Euthymic  Affect:  Constricted  Thought Process:  Goal Directed  Orientation:  Full (Time,  Place, and Person)  Thought Content:  Logical  Suicidal Thoughts:  No  Homicidal Thoughts:  No  Memory:  Immediate;   Good Recent;   Fair Remote;   Fair  Judgement:  Fair  Insight:  Fair  Psychomotor Activity:  Normal  Concentration:  Concentration: Fair  Recall:  [employer] of Knowledge:  Fair  Language:  Fair  Akathisia:  No  Handed:  Right  AIMS (if indicated):     Assets:  Communication Skills Desire for Improvement Housing Physical Health Social Support  ADL's:  Intact  Cognition:  WNL  Sleep:        Treatment Plan Summary: Plan 25 year old woman who is extremely agitated confused and behaving in a psychotic manner yesterday in the emergency room. Probably still detoxing and acutely intoxicated with multiple drugs. She has rested overnight and now is not expressing any acute symptoms. Denies depression. Denies suicidal or homicidal ideation. Denies psychotic symptoms. Not showing any sign of psychotic thinking. Patient is able to articulate and understand her problem and a reasonable plan going forward. Does not meet commitment criteria. Case reviewed with TTS and ER physician. Patient was taken off involuntary commitment papers. She can be discharged and will plan to follow up at [facility]. Her mother, who is her healthcare power of attorney, had left a note requesting that she be updated. I asked the patient if she would like me to call her mother and let her know that she is leaving and the patient specifically stated that she did not want me to do that and that she would talk to her mother herself.  Disposition: Patient does not meet criteria for psychiatric inpatient  admission. Supportive therapy provided about ongoing stressors.  Alethia Berthold, MD 08/23/2016 12:27 PM

## 2016-08-23 NOTE — ED Notes (Signed)
Pt given breakfast tray at this time. 

## 2016-08-23 NOTE — ED Provider Notes (Signed)
Cleared for d/c by Dr. Toni Amendlapacs of psychiatry.   Dana Everyobert Darreon Lutes, MD 08/23/16 [phone]

## 2017-10-26 ENCOUNTER — Emergency Department
Admission: EM | Admit: 2017-10-26 | Discharge: 2017-10-26 | Disposition: A | Discharge status: Home / Self Care | Payer: No Typology Code available for payment source | Attending: Emergency Medicine | Admitting: Emergency Medicine

## 2017-10-26 ENCOUNTER — Other Ambulatory Visit: Payer: Self-pay

## 2017-10-26 DIAGNOSIS — F1510 Other stimulant abuse, uncomplicated: Secondary | ICD-10-CM | POA: Insufficient documentation

## 2017-10-26 DIAGNOSIS — F17200 Nicotine dependence, unspecified, uncomplicated: Secondary | ICD-10-CM | POA: Insufficient documentation

## 2017-10-26 DIAGNOSIS — Z79899 Other long term (current) drug therapy: Secondary | ICD-10-CM | POA: Insufficient documentation

## 2017-10-26 DIAGNOSIS — F1910 Other psychoactive substance abuse, uncomplicated: Secondary | ICD-10-CM

## 2017-10-26 DIAGNOSIS — R1084 Generalized abdominal pain: Secondary | ICD-10-CM | POA: Insufficient documentation

## 2017-10-26 DIAGNOSIS — R109 Unspecified abdominal pain: Secondary | ICD-10-CM

## 2017-10-26 LAB — URINALYSIS, COMPLETE (UACMP) WITH MICROSCOPIC
Bilirubin Urine: NEGATIVE
GLUCOSE, UA: NEGATIVE mg/dL
Hgb urine dipstick: NEGATIVE
KETONES UR: 20 mg/dL — AB
Leukocytes, UA: NEGATIVE
NITRITE: NEGATIVE
PH: 6 (ref 5.0–8.0)
PROTEIN: 30 mg/dL — AB
Specific Gravity, Urine: 1.021 (ref 1.005–1.030)

## 2017-10-26 LAB — CBC WITH DIFFERENTIAL/PLATELET
Basophils Absolute: 0.1 10*3/uL (ref 0–0.1)
Basophils Relative: 1 %
Eosinophils Absolute: 0.1 10*3/uL (ref 0–0.7)
Eosinophils Relative: 1 %
HEMATOCRIT: 41.9 % (ref 35.0–47.0)
HEMOGLOBIN: 14.4 g/dL (ref 12.0–16.0)
LYMPHS ABS: 3.5 10*3/uL (ref 1.0–3.6)
LYMPHS PCT: 29 %
MCH: 33.8 pg (ref 26.0–34.0)
MCHC: 34.3 g/dL (ref 32.0–36.0)
MCV: 98.5 fL (ref 80.0–100.0)
MONOS PCT: 8 %
Monocytes Absolute: 0.9 10*3/uL (ref 0.2–0.9)
NEUTROS ABS: 7.3 10*3/uL — AB (ref 1.4–6.5)
NEUTROS PCT: 61 %
Platelets: 365 10*3/uL (ref 150–440)
RBC: 4.25 MIL/uL (ref 3.80–5.20)
RDW: 13.1 % (ref 11.5–14.5)
WBC: 12 10*3/uL — ABNORMAL HIGH (ref 3.6–11.0)

## 2017-10-26 LAB — URINE DRUG SCREEN, QUALITATIVE (ARMC ONLY)
Amphetamines, Ur Screen: POSITIVE — AB
BENZODIAZEPINE, UR SCRN: NOT DETECTED
Barbiturates, Ur Screen: NOT DETECTED
Cannabinoid 50 Ng, Ur ~~LOC~~: POSITIVE — AB
Cocaine Metabolite,Ur ~~LOC~~: POSITIVE — AB
MDMA (ECSTASY) UR SCREEN: NOT DETECTED
METHADONE SCREEN, URINE: NOT DETECTED
Opiate, Ur Screen: NOT DETECTED
PHENCYCLIDINE (PCP) UR S: NOT DETECTED
Tricyclic, Ur Screen: NOT DETECTED

## 2017-10-26 LAB — COMPREHENSIVE METABOLIC PANEL
ALK PHOS: 65 U/L (ref 38–126)
ALT: 17 U/L (ref 14–54)
ANION GAP: 7 (ref 5–15)
AST: 27 U/L (ref 15–41)
Albumin: 4 g/dL (ref 3.5–5.0)
BILIRUBIN TOTAL: 0.7 mg/dL (ref 0.3–1.2)
BUN: 6 mg/dL (ref 6–20)
CALCIUM: 9.2 mg/dL (ref 8.9–10.3)
CO2: 26 mmol/L (ref 22–32)
CREATININE: 0.77 mg/dL (ref 0.44–1.00)
Chloride: 107 mmol/L (ref 101–111)
GFR calc non Af Amer: >60 mL/min (ref >60)
Glucose, Bld: 89 mg/dL (ref 65–99)
Potassium: 3.4 mmol/L — ABNORMAL LOW (ref 3.5–5.1)
Sodium: 140 mmol/L (ref 135–145)
TOTAL PROTEIN: 7.2 g/dL (ref 6.5–8.1)

## 2017-10-26 LAB — ETHANOL

## 2017-10-26 LAB — ACETAMINOPHEN LEVEL

## 2017-10-26 LAB — LIPASE, BLOOD: Lipase: 22 U/L (ref 11–51)

## 2017-10-26 LAB — SALICYLATE LEVEL

## 2017-10-26 LAB — HCG, QUANTITATIVE, PREGNANCY: hCG, Beta Chain, Quant, S: <1 m[IU]/mL (ref <5)

## 2017-10-26 MED ORDER — SODIUM CHLORIDE 0.9 % IV BOLUS (SEPSIS)
1000.0000 mL | Freq: Once | INTRAVENOUS | Status: AC
  Administered 2017-10-26: 1000 mL via INTRAVENOUS

## 2017-10-26 NOTE — ED Notes (Signed)
Belongings given back to patient. Pt signed form and placed on paper chart.

## 2017-10-26 NOTE — ED Notes (Signed)
Pt waiting for belongings from the safe.

## 2017-10-26 NOTE — ED Triage Notes (Signed)
Pt came to ED via EMS. Per EMS, pt took meth. C/o abdominal pain. Pt is hysterical on arrival, cannot get much info out of pt.

## 2017-10-26 NOTE — ED Notes (Signed)

## 2017-10-26 NOTE — Discharge Instructions (Signed)
You have been seen in the Emergency Department (ED) today for substance abuse.  You have been evaluated by the behavioral medicine specialists and are being [facility]  No driving today or while using illegal drugs or methamphetamine.  Methamphetamine use is dangerous as well as other illegal drugs and they can kill you.  Please stop using drugs.  Please return to the ED immediately if you have ANY thoughts of hurting yourself or anyone else, so that we may help you.  Please avoid alcohol and drug use.  Follow up with your doctor and/or therapist as soon as possible regarding today's ED  visit.   Please follow up any other recommendations and clinic appointments provided by the psychiatry team that saw you in the Emergency Department.   Please return to the emergency room right away if you are to develop a fever, severe nausea, your pain becomes severe or worsens, you are unable to keep food down, begin vomiting any dark or bloody fluid, you develop any dark or bloody stools, feel dehydrated, or other new concerns or symptoms arise.

## 2017-10-26 NOTE — ED Notes (Signed)
Pt calm and cooperative at this time. Camera set up in pts room for soc.

## 2017-10-26 NOTE — ED Provider Notes (Signed)
-----------------------------------------   7:53 PM on 10/26/2017 -----------------------------------------  Patient resting comfortably at this time.  She does admit freely to methamphetamine use today.  She is alert oriented and in no distress.  Her involuntary commitment has been rescinded by psychiatry and they have recommended discharge with recommendations for outpatient substance abuse treatment.  I reexamined the patient, heart rate and vital signs have improved.  She is resting comfortably alert and oriented requesting discharge.  She reports her abdominal pain is gone, she denies any ongoing pain nausea or symptoms.  She will secure a ride to take her home.  On reexam her abdomen soft nontender nondistended in all quadrants.  She is alert and well oriented.  Heart rate 90.  Resting comfortably in no distress and has eaten and taken fluids without any symptomatology.  She appears appropriate for discharge, I have discussed with her careful return precautions as well as the recommendations to follow-up for substance abuse treatment  Return precautions and treatment recommendations and follow-up discussed with the patient who is agreeable with the plan.    Sharyn CreamerQuale, Mark, MD 10/26/17 Corky Crafts1955

## 2017-10-26 NOTE — ED Notes (Signed)
[facility]  [phone]

## 2017-10-26 NOTE — ED Notes (Signed)
Pts belongings in patient belonging bag which includes a shirt, pants, underwear, and slipper shoes. Pt also had 85 dollars in cas (4 20 dollar bills and 1 5 dollar bill) and mastercard. Also had 1 silver colored ring. Cash, card, and ring locked up in safe. Key placed on pt chart.

## 2017-10-26 NOTE — ED Notes (Signed)
Pt calm and cooperative at this time.

## 2017-10-26 NOTE — ED Notes (Addendum)
While undressing pt, per behavioral protocol, pt pulled out a small bag of unknown substance. Security called [employer] officer out front.

## 2017-10-26 NOTE — ED Notes (Signed)
Pt requesting to leave. Dr Fanny BienQuale informed.

## 2017-10-26 NOTE — ED Notes (Signed)
Pt heart elevated at 115. MD notified. 2nd liter of fluid ordered.

## 2017-10-26 NOTE — ED Provider Notes (Signed)
[facility] Emergency Department Provider Note  ___________________________________________   First MD Initiated Contact with Patient 10/26/17 1348     (approximate)  I have reviewed the triage vital signs and the nursing notes.   HISTORY  Chief Complaint Drug Overdose   HPI Dana Bradshaw is a 26 y.o. female with a history of anxiety, depression and amphetamine and psychostimulant-induced psychosis who is presenting to the emergency department today with abdominal pain.  She admitted to EMS that she was using meth prior to arrival.  She says that she shoots the meth in her arms.  She is unable to give the details of her abdominal pain including when it started if it is radiating.  However, she denies any nausea or vomiting.  Denies any diarrhea.  Denies any vaginal bleeding or discharge.  Past Medical History:  Diagnosis Date  . Anxiety   . Depression   . Seizures [facility])     Patient Active Problem List   Diagnosis Date Noted  . 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 Polysubstance dependence ([facility]) 08/23/2016    History reviewed. No pertinent surgical history.  Prior to Admission medications   Medication Sig Start Date End Date Taking? Authorizing Provider  ciprofloxacin (CIPRO) 500 MG tablet Take 1 tablet (500 mg total) by mouth 2 (two) times daily. 08/22/16   Sharman CheekStafford, Phillip, MD  citalopram (CELEXA) 20 MG tablet Take 20 mg by mouth daily.    [provider]  hydrOXYzine (ATARAX/VISTARIL) 25 MG tablet Take 25 mg by mouth 4 (four) times daily as needed.    [provider]    Allergies Penicillins  No family history on file.  Social History Social History   Tobacco Use  . Smoking status: Current Every Day Smoker  Substance Use Topics  . Alcohol use: Yes  . Drug use: Yes    Types: Methamphetamines    Review of Systems  Constitutional: No fever/chills Eyes: No visual  changes. ENT: No sore throat. Cardiovascular: Denies chest pain. Respiratory: Denies shortness of breath. Gastrointestinal: No nausea, no vomiting.  No diarrhea.  No constipation. Genitourinary: Negative for dysuria. Musculoskeletal: Negative for back pain. Skin: Negative for rash. Neurological: Negative for headaches, focal weakness or numbness.   ____________________________________________   PHYSICAL EXAM:  VITAL SIGNS: ED Triage Vitals  Enc Vitals Group     BP 10/26/17 1356 (!) 146/100     Pulse Rate 10/26/17 1356 (!) 105     Resp 10/26/17 1356 18     Temp 10/26/17 1356 98.5 F (36.9 C)     Temp Source 10/26/17 1356 Oral     SpO2 10/26/17 1356 100 %     Weight 10/26/17 1357 132 lb (59.9 kg)     Height --      Head Circumference --      Peak Flow --      Pain Score --      Pain Loc --      Pain Edu? --      Excl. in GC? --     Constitutional: Alert and oriented.  Patient is rocking back and forth on the bed, holding her abdomen.  Patient is tearful. Eyes: Conjunctivae are normal.  Head: Atraumatic. Nose: No congestion/rhinnorhea. Mouth/Throat: Mucous membranes are moist.  Neck: No stridor.   Cardiovascular: Tachycardic, regular rhythm. Grossly normal heart sounds.   Respiratory: Normal respiratory effort.  No retractions. Lungs CTAB. Gastrointestinal: Soft but with diffuse and moderate to severe  tenderness to palpation without any rebound or guarding.  No distention. Musculoskeletal: No lower extremity tenderness nor edema.  No joint effusions. Neurologic:   No gross focal neurologic deficits are appreciated. Skin: Several punctate needle marks to the bilateral anterior antecubital fossae.  However, there is no induration, no pus. Psychiatric: Patient with pressured speech.  Tearful.  ____________________________________________   LABS (all labs ordered are listed, but only abnormal results are displayed)  Labs Reviewed  CBC WITH DIFFERENTIAL/PLATELET -  Abnormal; Notable for the following components:      Result Value   WBC 12.0 (*)    Neutro Abs 7.3 (*)    All other components within normal limits  COMPREHENSIVE METABOLIC PANEL - Abnormal; Notable for the following components:   Potassium 3.4 (*)    All other components within normal limits  ACETAMINOPHEN LEVEL - Abnormal; Notable for the following components:   Acetaminophen (Tylenol), Serum <10 (*)    All other components within normal limits  LIPASE, BLOOD  SALICYLATE LEVEL  URINALYSIS, COMPLETE (UACMP) WITH MICROSCOPIC  URINE DRUG SCREEN, QUALITATIVE ([facility] ONLY)  HCG, QUANTITATIVE, PREGNANCY   ____________________________________________  EKG  ED ECG REPORT I, Arelia Longestavid M Kindred Reidinger, the attending physician, personally viewed and interpreted this ECG.   Date: 10/26/2017  EKG Time: 1421  Rate: 100  Rhythm: sinus tachycardia  Axis: normal  Intervals:none  ST&T Change: No ST segment elevation or depression.  No abnormal T wave inversion.  ____________________________________________  RADIOLOGY   ____________________________________________   PROCEDURES  Procedure(s) performed:   Procedures  Critical Care performed:   ____________________________________________   INITIAL IMPRESSION / ASSESSMENT AND PLAN / ED COURSE  Pertinent labs & imaging results that were available during my care of the patient were reviewed by me and considered in my medical decision making (see chart for details).  Differential diagnosis includes, but is not limited to, ovarian cyst, ovarian torsion, acute appendicitis, diverticulitis, urinary tract infection/pyelonephritis, endometriosis, bowel obstruction, colitis, renal colic, gastroenteritis, hernia, fibroids, endometriosis, pregnancy related pain including ectopic pregnancy, etc. Differential diagnosis includes, but is not limited to, biliary disease (biliary colic, acute cholecystitis, cholangitis, choledocholithiasis, etc),  intrathoracic causes for epigastric abdominal pain including ACS, gastritis, duodenitis, pancreatitis, small bowel or large bowel obstruction, abdominal aortic aneurysm, hernia, and gastritis. Polysubstance abuse, psychosis As part of my medical decision making, I reviewed the following data within the electronic MEDICAL RECORD NUMBER Notes from prior ED visits  ----------------------------------------- 3:00 PM on 10/26/2017 -----------------------------------------  Patient threatened to leave.  Because of her intoxicated state I placed under involuntary commitment.  Says that her abdominal pain is improving but she appears to continue to be rocking back and forth on the bed.  Unclear if the patient has genuine pathology of this is drug related.  Patient will require more observation.  He appears to have elevated white blood cell count but this appears chronic.  Signed out to Dr. Fanny BienQuale.       ____________________________________________   FINAL CLINICAL IMPRESSION(S) / ED DIAGNOSES  Methamphetamine abuse.  Abdominal pain.    NEW MEDICATIONS STARTED DURING THIS VISIT:  This SmartLink is deprecated. Use AVSMEDLIST instead to display the medication list for a patient.   Note:  This document was prepared using Dragon voice recognition software and may include unintentional dictation errors.     Myrna BlazerSchaevitz, Rafaela Dinius Matthew, MD 10/26/17 1501

## 2017-10-26 NOTE — ED Notes (Signed)
Pt eating. Reports feels better

## 2017-10-28 LAB — URINE CULTURE
Culture: >=100000 — AB
SPECIAL REQUESTS: NORMAL

## 2017-10-30 NOTE — Progress Notes (Signed)
ED Antimicrobial Stewardship Positive Culture Follow Up   Dana EtienneDallas R Bradshaw is an 26 y.o. female who presented to [facility] on 10/26/2017 with a chief complaint of  Chief Complaint  Patient presents with  . Drug Overdose  Abdominal pain  Recent Results (from the past 720 hour(s))  Urine Culture     Status: Abnormal   Collection Time: 10/26/17  5:15 PM  Result Value Ref Range Status   Specimen Description   Final    URINE, RANDOM Performed at [facility], [address] [place], [place] [address]    Special Requests   Final    Normal Performed at [facility], [address] [place], [place] [address]    Culture >=100,000 COLONIES/mL VIRIDANS STREPTOCOCCUS (A)  Final   Report Status 10/28/2017 FINAL  Final   PCN allergy = "told as a child not to take it"  [x]  Patient discharged originally without antimicrobial agent and treatment is now indicated  New antibiotic prescription: Cephalexin 500 mg PO q12h x 7 days  ED Provider: Darnelle CatalanMalinda  12/25: Attempt #1: Called [phone] and spoke with patient's Mother. Mother gave me patient's cell phone # [phone]. Called cell phone and left a voicemail to return call to [facility] pharmacy concerning ER visit culture  10/30/17 15:34 called cell phone again. LM, no patient information left on voice mail. Theus Espin A. [place], [place] BCPS   Merrill,Kristin A 10/30/2017, 3:16 PM

## 2017-11-01 NOTE — Progress Notes (Signed)
ED Antimicrobial Stewardship Positive Culture Follow Up   Dana Bradshaw is an 26 y.o. female who presented to [facility] on 10/26/2017 with a chief complaint of  Chief Complaint  Patient presents with  . Drug Overdose  Abdominal pain  Recent Results (from the past 720 hour(s))  Urine Culture     Status: Abnormal   Collection Time: 10/26/17  5:15 PM  Result Value Ref Range Status   Specimen Description   Final    URINE, RANDOM Performed at [facility], [address] [place], [place] [address]    Special Requests   Final    Normal Performed at [facility], [address] [place], [place] [address]    Culture >=100,000 COLONIES/mL VIRIDANS STREPTOCOCCUS (A)  Final   Report Status 10/28/2017 FINAL  Final   PCN allergy = "told as a child not to take it"  [x]  Patient discharged originally without antimicrobial agent and treatment is now indicated  New antibiotic prescription: Cephalexin 500 mg PO q12h x 7 days  ED Provider: Darnelle CatalanMalinda  12/25: Attempt #1: Called [phone] and spoke with patient's Mother. Mother gave me patient's cell phone # [phone]. Called cell phone and left a voicemail to return call to [facility] pharmacy concerning ER visit culture  10/30/17 15:34 called cell phone again. LM, no patient information left on voice mail. Nathan A. [place], [place] BCPS  12/28:  3rd attempt:  Finally able to contact patient on cell phone #[phone]. Patient's preferred pharmacy [facility] [address] Called in Rx for cephalexin as above, to [phone]. Discussed to monitor for side effects/allergic rxn. Bari MantisKristin Tannya Gonet PharmD Clinical Pharmacist 11/01/2017    Treveon Bourcier A 11/01/2017, 1:46 PM

## 2017-11-23 ENCOUNTER — Emergency Department
Admission: EM | Admit: 2017-11-23 | Discharge: 2017-11-23 | Disposition: A | Discharge status: Home / Self Care | Payer: No Typology Code available for payment source | Attending: Emergency Medicine | Admitting: Emergency Medicine

## 2017-11-23 ENCOUNTER — Encounter: Payer: Self-pay | Admitting: Emergency Medicine

## 2017-11-23 DIAGNOSIS — N949 Unspecified condition associated with female genital organs and menstrual cycle: Secondary | ICD-10-CM

## 2017-11-23 DIAGNOSIS — F17210 Nicotine dependence, cigarettes, uncomplicated: Secondary | ICD-10-CM | POA: Insufficient documentation

## 2017-11-23 DIAGNOSIS — F1510 Other stimulant abuse, uncomplicated: Secondary | ICD-10-CM | POA: Insufficient documentation

## 2017-11-23 DIAGNOSIS — A749 Chlamydial infection, unspecified: Secondary | ICD-10-CM | POA: Insufficient documentation

## 2017-11-23 DIAGNOSIS — Z79899 Other long term (current) drug therapy: Secondary | ICD-10-CM | POA: Insufficient documentation

## 2017-11-23 LAB — COMPREHENSIVE METABOLIC PANEL
ALT: 17 U/L (ref 14–54)
ANION GAP: 8 (ref 5–15)
AST: 15 U/L (ref 15–41)
Albumin: 3.9 g/dL (ref 3.5–5.0)
Alkaline Phosphatase: 65 U/L (ref 38–126)
BUN: 15 mg/dL (ref 6–20)
CHLORIDE: 106 mmol/L (ref 101–111)
CO2: 24 mmol/L (ref 22–32)
Calcium: 9 mg/dL (ref 8.9–10.3)
Creatinine, Ser: 0.75 mg/dL (ref 0.44–1.00)
Glucose, Bld: 101 mg/dL — ABNORMAL HIGH (ref 65–99)
POTASSIUM: 3.8 mmol/L (ref 3.5–5.1)
SODIUM: 138 mmol/L (ref 135–145)
Total Bilirubin: 0.7 mg/dL (ref 0.3–1.2)
Total Protein: 7.2 g/dL (ref 6.5–8.1)

## 2017-11-23 LAB — URINE DRUG SCREEN, QUALITATIVE (ARMC ONLY)
Amphetamines, Ur Screen: POSITIVE — AB
BARBITURATES, UR SCREEN: NOT DETECTED
BENZODIAZEPINE, UR SCRN: NOT DETECTED
CANNABINOID 50 NG, UR ~~LOC~~: POSITIVE — AB
COCAINE METABOLITE, UR ~~LOC~~: NOT DETECTED
MDMA (Ecstasy)Ur Screen: NOT DETECTED
Methadone Scn, Ur: NOT DETECTED
OPIATE, UR SCREEN: NOT DETECTED
Phencyclidine (PCP) Ur S: NOT DETECTED
TRICYCLIC, UR SCREEN: NOT DETECTED

## 2017-11-23 LAB — URINALYSIS, COMPLETE (UACMP) WITH MICROSCOPIC
BACTERIA UA: NONE SEEN
BILIRUBIN URINE: NEGATIVE
Glucose, UA: NEGATIVE mg/dL
HGB URINE DIPSTICK: NEGATIVE
KETONES UR: NEGATIVE mg/dL
Leukocytes, UA: NEGATIVE
Nitrite: NEGATIVE
Protein, ur: NEGATIVE mg/dL
SPECIFIC GRAVITY, URINE: 1.027 (ref 1.005–1.030)
pH: 5 (ref 5.0–8.0)

## 2017-11-23 LAB — CBC WITH DIFFERENTIAL/PLATELET
Basophils Absolute: 0 10*3/uL (ref 0–0.1)
Basophils Relative: 1 %
EOS ABS: 0.1 10*3/uL (ref 0–0.7)
Eosinophils Relative: 1 %
HEMATOCRIT: 42 % (ref 35.0–47.0)
HEMOGLOBIN: 14.3 g/dL (ref 12.0–16.0)
LYMPHS ABS: 2.3 10*3/uL (ref 1.0–3.6)
LYMPHS PCT: 22 %
MCH: 33.9 pg (ref 26.0–34.0)
MCHC: 34 g/dL (ref 32.0–36.0)
MCV: 99.5 fL (ref 80.0–100.0)
MONOS PCT: 5 %
Monocytes Absolute: 0.5 10*3/uL (ref 0.2–0.9)
NEUTROS ABS: 7.2 10*3/uL — AB (ref 1.4–6.5)
NEUTROS PCT: 71 %
Platelets: 400 10*3/uL (ref 150–440)
RBC: 4.22 MIL/uL (ref 3.80–5.20)
RDW: 13.6 % (ref 11.5–14.5)
WBC: 10.2 10*3/uL (ref 3.6–11.0)

## 2017-11-23 LAB — CHLAMYDIA/NGC RT PCR (ARMC ONLY)
CHLAMYDIA TR: DETECTED — AB
N GONORRHOEAE: NOT DETECTED

## 2017-11-23 LAB — WET PREP, GENITAL
Clue Cells Wet Prep HPF POC: NONE SEEN
SPERM: NONE SEEN
Trich, Wet Prep: NONE SEEN
YEAST WET PREP: NONE SEEN

## 2017-11-23 LAB — ETHANOL: Alcohol, Ethyl (B): <10 mg/dL (ref <10)

## 2017-11-23 LAB — ACETAMINOPHEN LEVEL: Acetaminophen (Tylenol), Serum: <10 ug/mL — ABNORMAL LOW (ref 10–30)

## 2017-11-23 LAB — SALICYLATE LEVEL: Salicylate Lvl: <7 mg/dL (ref 2.8–30.0)

## 2017-11-23 MED ORDER — CEFTRIAXONE SODIUM 250 MG IJ SOLR
250.0000 mg | Freq: Once | INTRAMUSCULAR | Status: AC
  Administered 2017-11-23: 250 mg via INTRAMUSCULAR
  Filled 2017-11-23: qty 250

## 2017-11-23 MED ORDER — NICOTINE 14 MG/24HR TD PT24
14.0000 mg | MEDICATED_PATCH | Freq: Once | TRANSDERMAL | Status: DC
  Administered 2017-11-23: 14 mg via TRANSDERMAL
  Filled 2017-11-23: qty 1

## 2017-11-23 MED ORDER — LIDOCAINE HCL (PF) 1 % IJ SOLN
INTRAMUSCULAR | Status: AC
  Administered 2017-11-23: 0.9 mL
  Filled 2017-11-23: qty 5

## 2017-11-23 MED ORDER — DOXYCYCLINE HYCLATE 100 MG PO TABS: 100.0000 mg | ORAL_TABLET | Freq: Two times a day (BID) | ORAL | 0 refills | Status: AC

## 2017-11-23 MED ORDER — GUAIFENESIN 200 MG PO TABS: 200.0000 mg | ORAL_TABLET | ORAL | 0 refills | Status: AC | PRN

## 2017-11-23 MED ORDER — ULIPRISTAL ACETATE 30 MG PO TABS
30.0000 mg | ORAL_TABLET | Freq: Once | ORAL | Status: AC
  Administered 2017-11-23: 30 mg via ORAL
  Filled 2017-11-23: qty 1
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
## 2017-11-23 NOTE — Discharge Instructions (Signed)
You may follow-up with [facility] or any of the other outpatient counseling and programs provided in the list here.  Return to the emergency department for new or worsening vaginal discomfort, discharge, bleeding, abdominal pain, fevers, or any other new or worsening symptoms that concern you.  Take the antibiotic (doxycycline) as prescribed and finish the full course.  You should also return to the emergency department if you have any thoughts of wanting to hurt yourself or anyone else, feel unsafe in your living situation or are being abused, or if you have any other mental health or safety concerns.

## 2019-02-22 ENCOUNTER — Other Ambulatory Visit: Payer: Self-pay

## 2019-02-22 ENCOUNTER — Emergency Department
Admission: EM | Admit: 2019-02-22 | Discharge: 2019-02-22 | Disposition: A | Discharge status: Home / Self Care | Payer: No Typology Code available for payment source | Attending: Emergency Medicine | Admitting: Emergency Medicine

## 2019-02-22 ENCOUNTER — Emergency Department: Payer: No Typology Code available for payment source

## 2019-02-22 ENCOUNTER — Encounter: Payer: Self-pay | Admitting: *Deleted

## 2019-02-22 DIAGNOSIS — L089 Local infection of the skin and subcutaneous tissue, unspecified: Secondary | ICD-10-CM | POA: Insufficient documentation

## 2019-02-22 DIAGNOSIS — S43006A Unspecified dislocation of unspecified shoulder joint, initial encounter: Secondary | ICD-10-CM

## 2019-02-22 DIAGNOSIS — Z23 Encounter for immunization: Secondary | ICD-10-CM | POA: Diagnosis not present

## 2019-02-22 DIAGNOSIS — R51 Headache: Secondary | ICD-10-CM | POA: Insufficient documentation

## 2019-02-22 DIAGNOSIS — F17200 Nicotine dependence, unspecified, uncomplicated: Secondary | ICD-10-CM | POA: Insufficient documentation

## 2019-02-22 DIAGNOSIS — Y939 Activity, unspecified: Secondary | ICD-10-CM | POA: Insufficient documentation

## 2019-02-22 DIAGNOSIS — Z79899 Other long term (current) drug therapy: Secondary | ICD-10-CM | POA: Insufficient documentation

## 2019-02-22 DIAGNOSIS — Y999 Unspecified external cause status: Secondary | ICD-10-CM | POA: Insufficient documentation

## 2019-02-22 DIAGNOSIS — S4992XA Unspecified injury of left shoulder and upper arm, initial encounter: Secondary | ICD-10-CM | POA: Diagnosis present

## 2019-02-22 DIAGNOSIS — T148XXA Other injury of unspecified body region, initial encounter: Secondary | ICD-10-CM

## 2019-02-22 DIAGNOSIS — Y92410 Unspecified street and highway as the place of occurrence of the external cause: Secondary | ICD-10-CM | POA: Diagnosis not present

## 2019-02-22 DIAGNOSIS — S43005A Unspecified dislocation of left shoulder joint, initial encounter: Secondary | ICD-10-CM | POA: Diagnosis not present

## 2019-02-22 MED ORDER — CLINDAMYCIN PHOSPHATE 600 MG/4ML IJ SOLN
600.0000 mg | Freq: Once | INTRAMUSCULAR | Status: AC
  Administered 2019-02-22: 600 mg via INTRAMUSCULAR
  Filled 2019-02-22: qty 4

## 2019-02-22 MED ORDER — TETANUS-DIPHTH-ACELL PERTUSSIS 5-2.5-18.5 LF-MCG/0.5 IM SUSP
0.5000 mL | Freq: Once | INTRAMUSCULAR | Status: AC
  Administered 2019-02-22: 0.5 mL via INTRAMUSCULAR
  Filled 2019-02-22: qty 0.5

## 2019-02-22 MED ORDER — CLINDAMYCIN HCL 300 MG PO CAPS: 300.0000 mg | ORAL_CAPSULE | Freq: Four times a day (QID) | ORAL | 0 refills | Status: AC
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
## 2019-02-22 NOTE — Discharge Instructions (Addendum)
Please begin clindamycin for infection to knee.  Please return the emergency department for any worsening signs of infection including increased redness, drainage, pain, fevers.  Please follow-up with orthopedics for follow-up of your shoulder separation to prevent permament damage to your shoulder.  Wear sling until you see orthopedics.
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
## 2019-02-22 NOTE — ED Notes (Signed)
Patient transported to CT 

## 2019-07-12 ENCOUNTER — Emergency Department
Admission: EM | Admit: 2019-07-12 | Discharge: 2019-07-12 | Disposition: A | Discharge status: Home / Self Care | Payer: Self-pay | Attending: Student in an Organized Health Care Education/Training Program | Admitting: Student in an Organized Health Care Education/Training Program

## 2019-07-12 ENCOUNTER — Other Ambulatory Visit: Payer: Self-pay

## 2019-07-12 DIAGNOSIS — T50901A Poisoning by unspecified drugs, medicaments and biological substances, accidental (unintentional), initial encounter: Secondary | ICD-10-CM | POA: Insufficient documentation

## 2019-07-12 DIAGNOSIS — F329 Major depressive disorder, single episode, unspecified: Secondary | ICD-10-CM | POA: Insufficient documentation

## 2019-07-12 DIAGNOSIS — F1910 Other psychoactive substance abuse, uncomplicated: Secondary | ICD-10-CM | POA: Insufficient documentation

## 2019-07-12 DIAGNOSIS — Z79899 Other long term (current) drug therapy: Secondary | ICD-10-CM | POA: Insufficient documentation

## 2019-07-12 DIAGNOSIS — F17200 Nicotine dependence, unspecified, uncomplicated: Secondary | ICD-10-CM | POA: Insufficient documentation

## 2019-07-12 NOTE — ED Notes (Signed)
Amy RN setting up TTS machine at bedside.

## 2019-07-12 NOTE — ED Notes (Signed)
EDP at bedside  
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
## 2019-07-12 NOTE — BH Assessment (Signed)
Additional Resources in [facility] [address]  [place], [place] [address] Phone: [phone] [email or web address]   [facility] [address], [place], [place] [address] Main Tel: [phone] Intake Tel: [phone]

## 2019-07-12 NOTE — ED Notes (Signed)
No orders for blood draw at this time.
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
## 2019-07-12 NOTE — BH Assessment (Signed)
TTS consulted with patient briefly at Nurse/EDP request. Dana Bradshaw reports a history of polysubstance use beginning around age 28 and continuing to the present day.  Cocaine is the primary drug of abuse along with marijuana and various opioids. The patient reports having an injury to her shoulder that requires surgery. She would like to have her psychiatric concerns addressed, she refers to them as "anxiety" and "adhd" and specifically identifies medications she would like to be prescribed (Klonopin, Adderall).  She also mentioned the possibility of a suboxone clinic.  TTS encouraged patient to consider residential treatment, and she is not interested at this time. Patient would like a resources list for treatment providers in [facility] which is available with her nurse. Additionally, she may consider the Opioid Treatment Program at [facility].  Patient was directed to call [facility] to get access to treatment.

## 2019-07-12 NOTE — ED Notes (Addendum)
Pt states drank a [place] ice tea today. + smoker. "it was supposed to be powder." admits to snorting medication. Pt admits to hx of OD.

## 2020-10-24 ENCOUNTER — Inpatient Hospital Stay
Admission: EM | Admit: 2020-10-24 | Discharge: 2020-11-05 | DRG: 917 | Disposition: E | Payer: Self-pay | Attending: Pulmonary Disease | Admitting: Pulmonary Disease

## 2020-10-24 ENCOUNTER — Inpatient Hospital Stay: Payer: Self-pay

## 2020-10-24 ENCOUNTER — Encounter: Payer: Self-pay | Admitting: Pulmonary Disease

## 2020-10-24 ENCOUNTER — Emergency Department: Payer: Self-pay

## 2020-10-24 ENCOUNTER — Other Ambulatory Visit: Payer: Self-pay

## 2020-10-24 DIAGNOSIS — Z20822 Contact with and (suspected) exposure to covid-19: Secondary | ICD-10-CM | POA: Diagnosis present

## 2020-10-24 DIAGNOSIS — J9601 Acute respiratory failure with hypoxia: Secondary | ICD-10-CM | POA: Diagnosis present

## 2020-10-24 DIAGNOSIS — F419 Anxiety disorder, unspecified: Secondary | ICD-10-CM | POA: Diagnosis present

## 2020-10-24 DIAGNOSIS — E875 Hyperkalemia: Secondary | ICD-10-CM | POA: Diagnosis present

## 2020-10-24 DIAGNOSIS — I469 Cardiac arrest, cause unspecified: Secondary | ICD-10-CM

## 2020-10-24 DIAGNOSIS — Z66 Do not resuscitate: Secondary | ICD-10-CM | POA: Diagnosis not present

## 2020-10-24 DIAGNOSIS — I959 Hypotension, unspecified: Secondary | ICD-10-CM | POA: Diagnosis present

## 2020-10-24 DIAGNOSIS — F17210 Nicotine dependence, cigarettes, uncomplicated: Secondary | ICD-10-CM | POA: Diagnosis present

## 2020-10-24 DIAGNOSIS — I468 Cardiac arrest due to other underlying condition: Secondary | ICD-10-CM | POA: Diagnosis present

## 2020-10-24 DIAGNOSIS — G936 Cerebral edema: Secondary | ICD-10-CM | POA: Diagnosis present

## 2020-10-24 DIAGNOSIS — Z515 Encounter for palliative care: Secondary | ICD-10-CM

## 2020-10-24 DIAGNOSIS — T50901A Poisoning by unspecified drugs, medicaments and biological substances, accidental (unintentional), initial encounter: Principal | ICD-10-CM | POA: Diagnosis present

## 2020-10-24 DIAGNOSIS — G931 Anoxic brain damage, not elsewhere classified: Secondary | ICD-10-CM | POA: Diagnosis present

## 2020-10-24 DIAGNOSIS — K922 Gastrointestinal hemorrhage, unspecified: Secondary | ICD-10-CM | POA: Diagnosis present

## 2020-10-24 DIAGNOSIS — R680 Hypothermia, not associated with low environmental temperature: Secondary | ICD-10-CM | POA: Diagnosis present

## 2020-10-24 DIAGNOSIS — F32A Depression, unspecified: Secondary | ICD-10-CM | POA: Diagnosis present

## 2020-10-24 LAB — TROPONIN I (HIGH SENSITIVITY)
Troponin I (High Sensitivity): 536 ng/L (ref <18)
Troponin I (High Sensitivity): 3619 ng/L (ref <18)

## 2020-10-24 LAB — COMPREHENSIVE METABOLIC PANEL
ALT: 2336 U/L — ABNORMAL HIGH (ref 0–44)
AST: 2069 U/L — ABNORMAL HIGH (ref 15–41)
Albumin: 2.8 g/dL — ABNORMAL LOW (ref 3.5–5.0)
Alkaline Phosphatase: 129 U/L — ABNORMAL HIGH (ref 38–126)
Anion gap: 23 — ABNORMAL HIGH (ref 5–15)
BUN: 17 mg/dL (ref 6–20)
CO2: 17 mmol/L — ABNORMAL LOW (ref 22–32)
Calcium: 8.7 mg/dL — ABNORMAL LOW (ref 8.9–10.3)
Chloride: 98 mmol/L (ref 98–111)
Creatinine, Ser: 1.32 mg/dL — ABNORMAL HIGH (ref 0.44–1.00)
GFR, Estimated: 56 mL/min — ABNORMAL LOW (ref >60)
Glucose, Bld: 335 mg/dL — ABNORMAL HIGH (ref 70–99)
Potassium: 6.2 mmol/L — ABNORMAL HIGH (ref 3.5–5.1)
Sodium: 138 mmol/L (ref 135–145)
Total Bilirubin: 0.9 mg/dL (ref 0.3–1.2)
Total Protein: 5.6 g/dL — ABNORMAL LOW (ref 6.5–8.1)

## 2020-10-24 LAB — CBC WITH DIFFERENTIAL/PLATELET
Abs Immature Granulocytes: 2.57 10*3/uL — ABNORMAL HIGH (ref 0.00–0.07)
Basophils Absolute: 0.2 10*3/uL — ABNORMAL HIGH (ref 0.0–0.1)
Basophils Relative: 1 %
Eosinophils Absolute: 0.3 10*3/uL (ref 0.0–0.5)
Eosinophils Relative: 1 %
HCT: 40.8 % (ref 36.0–46.0)
Hemoglobin: 12.8 g/dL (ref 12.0–15.0)
Immature Granulocytes: 8 %
Lymphocytes Relative: 58 %
Lymphs Abs: 19.9 10*3/uL — ABNORMAL HIGH (ref 0.7–4.0)
MCH: 34.3 pg — ABNORMAL HIGH (ref 26.0–34.0)
MCHC: 31.4 g/dL (ref 30.0–36.0)
MCV: 109.4 fL — ABNORMAL HIGH (ref 80.0–100.0)
Monocytes Absolute: 0.9 10*3/uL (ref 0.1–1.0)
Monocytes Relative: 3 %
Neutro Abs: 9.6 10*3/uL — ABNORMAL HIGH (ref 1.7–7.7)
Neutrophils Relative %: 29 %
Platelets: 202 10*3/uL (ref 150–400)
RBC: 3.73 MIL/uL — ABNORMAL LOW (ref 3.87–5.11)
RDW: 12.3 % (ref 11.5–15.5)
WBC: 33.5 10*3/uL — ABNORMAL HIGH (ref 4.0–10.5)
nRBC: 0.4 % — ABNORMAL HIGH (ref 0.0–0.2)

## 2020-10-24 LAB — URINALYSIS, COMPLETE (UACMP) WITH MICROSCOPIC
Bilirubin Urine: NEGATIVE
Glucose, UA: NEGATIVE mg/dL
Hgb urine dipstick: NEGATIVE
Ketones, ur: NEGATIVE mg/dL
Nitrite: NEGATIVE
Protein, ur: NEGATIVE mg/dL
Specific Gravity, Urine: 1.024 (ref 1.005–1.030)
pH: 5 (ref 5.0–8.0)

## 2020-10-24 LAB — RESP PANEL BY RT-PCR (FLU A&B, COVID) ARPGX2
Influenza A by PCR: NEGATIVE
Influenza B by PCR: NEGATIVE
SARS Coronavirus 2 by RT PCR: NEGATIVE

## 2020-10-24 LAB — URINE DRUG SCREEN, QUALITATIVE (ARMC ONLY)
Amphetamines, Ur Screen: NOT DETECTED
Barbiturates, Ur Screen: NOT DETECTED
Benzodiazepine, Ur Scrn: NOT DETECTED
Cannabinoid 50 Ng, Ur ~~LOC~~: NOT DETECTED
Cocaine Metabolite,Ur ~~LOC~~: NOT DETECTED
MDMA (Ecstasy)Ur Screen: NOT DETECTED
Methadone Scn, Ur: NOT DETECTED
Opiate, Ur Screen: NOT DETECTED
Phencyclidine (PCP) Ur S: NOT DETECTED
Tricyclic, Ur Screen: NOT DETECTED

## 2020-10-24 LAB — ACETAMINOPHEN LEVEL: Acetaminophen (Tylenol), Serum: <10 ug/mL — ABNORMAL LOW (ref 10–30)

## 2020-10-24 LAB — MRSA PCR SCREENING: MRSA by PCR: POSITIVE — AB

## 2020-10-24 LAB — ETHANOL: Alcohol, Ethyl (B): 12 mg/dL — ABNORMAL HIGH (ref <10)

## 2020-10-24 LAB — GLUCOSE, CAPILLARY: Glucose-Capillary: 214 mg/dL — ABNORMAL HIGH (ref 70–99)

## 2020-10-24 LAB — HIV ANTIBODY (ROUTINE TESTING W REFLEX): HIV Screen 4th Generation wRfx: NONREACTIVE

## 2020-10-24 LAB — PROCALCITONIN: Procalcitonin: 2.14 ng/mL

## 2020-10-24 LAB — LACTIC ACID, PLASMA: Lactic Acid, Venous: 9.3 mmol/L (ref 0.5–1.9)

## 2020-10-24 LAB — SALICYLATE LEVEL: Salicylate Lvl: <7 mg/dL — ABNORMAL LOW (ref 7.0–30.0)

## 2020-10-24 MED ORDER — PANTOPRAZOLE SODIUM 40 MG IV SOLR: 40.0000 mg | Freq: Every day | INTRAVENOUS | Status: DC

## 2020-10-24 MED ORDER — NALOXONE HCL 2 MG/2ML IJ SOSY
PREFILLED_SYRINGE | INTRAMUSCULAR | Status: DC | PRN
  Administered 2020-10-24: 4 mg via INTRAVENOUS

## 2020-10-24 MED ORDER — ONDANSETRON HCL 4 MG/2ML IJ SOLN: 4.0000 mg | Freq: Four times a day (QID) | INTRAMUSCULAR | Status: DC | PRN

## 2020-10-24 MED ORDER — NOREPINEPHRINE 4 MG/250ML-% IV SOLN
0.0000 ug/min | INTRAVENOUS | Status: DC
  Administered 2020-10-24: 02:00:00 20 ug/min via INTRAVENOUS
  Administered 2020-10-24: 04:00:00 35 ug/min via INTRAVENOUS
  Filled 2020-10-24: qty 250

## 2020-10-24 MED ORDER — DIPHENHYDRAMINE HCL 50 MG/ML IJ SOLN: 25.0000 mg | INTRAMUSCULAR | Status: DC | PRN

## 2020-10-24 MED ORDER — SODIUM CHLORIDE 0.9 % IV SOLN
8.0000 mg/h | INTRAVENOUS | Status: DC
  Administered 2020-10-24: 04:00:00 8 mg/h via INTRAVENOUS
  Filled 2020-10-24: qty 80

## 2020-10-24 MED ORDER — GLYCOPYRROLATE 1 MG PO TABS
1.0000 mg | ORAL_TABLET | ORAL | Status: DC | PRN
  Filled 2020-10-24: qty 1

## 2020-10-24 MED ORDER — SODIUM BICARBONATE 8.4 % IV SOLN
INTRAVENOUS | Status: DC | PRN
Start: 2020-10-24 — End: 2020-10-24
  Administered 2020-10-24: 50 meq via INTRAVENOUS

## 2020-10-24 MED ORDER — DOCUSATE SODIUM 100 MG PO CAPS: 100.0000 mg | ORAL_CAPSULE | Freq: Two times a day (BID) | ORAL | Status: DC | PRN

## 2020-10-24 MED ORDER — GLYCOPYRROLATE 0.2 MG/ML IJ SOLN: 0.2000 mg | INTRAMUSCULAR | Status: DC | PRN

## 2020-10-24 MED ORDER — INSULIN ASPART 100 UNIT/ML IV SOLN
10.0000 [IU] | Freq: Once | INTRAVENOUS | Status: AC
  Administered 2020-10-24: 05:00:00 10 [IU] via INTRAVENOUS
  Filled 2020-10-24: qty 0.1

## 2020-10-24 MED ORDER — SODIUM CHLORIDE 0.9 % IV BOLUS
1000.0000 mL | Freq: Once | INTRAVENOUS | Status: AC
  Administered 2020-10-24: 02:00:00 1000 mL via INTRAVENOUS

## 2020-10-24 MED ORDER — EPINEPHRINE HCL 5 MG/250ML IV SOLN IN NS
0.5000 ug/min | INTRAVENOUS | Status: DC
  Administered 2020-10-24: 10 ug/min via INTRAVENOUS
  Filled 2020-10-24 (×2): qty 250

## 2020-10-24 MED ORDER — DEXTROSE 5 % IV SOLN: INTRAVENOUS | Status: DC

## 2020-10-24 MED ORDER — SODIUM CHLORIDE 0.9 % IV SOLN
80.0000 mg | Freq: Once | INTRAVENOUS | Status: AC
  Administered 2020-10-24: 04:00:00 80 mg via INTRAVENOUS
  Filled 2020-10-24: qty 80

## 2020-10-24 MED ORDER — EPINEPHRINE 1 MG/10ML IJ SOSY
PREFILLED_SYRINGE | INTRAMUSCULAR | Status: DC | PRN
  Administered 2020-10-24 (×5): 1 mg via INTRAVENOUS

## 2020-10-24 MED ORDER — DEXTROSE 50 % IV SOLN
1.0000 | Freq: Once | INTRAVENOUS | Status: AC
  Administered 2020-10-24: 05:00:00 50 mL via INTRAVENOUS
  Filled 2020-10-24: qty 50

## 2020-10-24 MED ORDER — CALCIUM GLUCONATE-NACL 1-0.675 GM/50ML-% IV SOLN
1.0000 g | Freq: Once | INTRAVENOUS | Status: AC
  Administered 2020-10-24: 05:00:00 1000 mg via INTRAVENOUS
  Filled 2020-10-24: qty 50

## 2020-10-24 MED ORDER — CHLORHEXIDINE GLUCONATE CLOTH 2 % EX PADS: 6.0000 | MEDICATED_PAD | Freq: Every day | CUTANEOUS | Status: DC

## 2020-10-24 MED ORDER — SODIUM CHLORIDE 0.9 % IV SOLN
1.0000 g | Freq: Once | INTRAVENOUS | Status: DC
  Filled 2020-10-24: qty 1

## 2020-10-24 MED ORDER — SODIUM BICARBONATE 8.4 % IV SOLN
50.0000 meq | Freq: Once | INTRAVENOUS | Status: AC
  Administered 2020-10-24: 05:00:00 50 meq via INTRAVENOUS
  Filled 2020-10-24: qty 50

## 2020-10-24 MED ORDER — POLYETHYLENE GLYCOL 3350 17 G PO PACK: 17.0000 g | PACK | Freq: Every day | ORAL | Status: DC | PRN

## 2020-10-24 MED ORDER — ATROPINE SULFATE 1 MG/ML IJ SOLN
INTRAMUSCULAR | Status: DC | PRN
  Administered 2020-10-24: 1 mg via INTRAVENOUS

## 2020-10-24 MED ORDER — MIDAZOLAM HCL 2 MG/2ML IJ SOLN: 2.0000 mg | INTRAMUSCULAR | Status: DC | PRN

## 2020-10-24 MED ORDER — MORPHINE SULFATE (PF) 2 MG/ML IV SOLN: 2.0000 mg | INTRAVENOUS | Status: DC | PRN

## 2020-10-24 MED ORDER — ACETAMINOPHEN 325 MG PO TABS: 650.0000 mg | ORAL_TABLET | ORAL | Status: DC | PRN

## 2020-10-24 MED ORDER — PANTOPRAZOLE SODIUM 40 MG IV SOLR: 40.0000 mg | Freq: Two times a day (BID) | INTRAVENOUS | Status: DC

## 2020-10-24 MED ORDER — POLYVINYL ALCOHOL 1.4 % OP SOLN
1.0000 [drp] | Freq: Four times a day (QID) | OPHTHALMIC | Status: DC | PRN
  Filled 2020-10-24: qty 15

## 2020-10-24 MED ORDER — HEPARIN SODIUM (PORCINE) 5000 UNIT/ML IJ SOLN: 5000.0000 [IU] | Freq: Three times a day (TID) | INTRAMUSCULAR | Status: DC

## 2020-11-05 NOTE — Progress Notes (Signed)
Patients parents made decision to make her comfort care. Patient extubated by RT and [name]. Patients parents at bedside post extubation.
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
## 2020-11-05 NOTE — Progress Notes (Addendum)
   16-Nov-2020 0155  Clinical Encounter Type  Visited With Patient and family together  Visit Type Initial  Referral From Nurse  Consult/Referral To Chaplain  Chaplain responded to page at 1:55 am. When she arrived at the ER, she was told that Pt's boyfriend and father were here. Pt's father was in the family consult room and Pt's boyfriend was in the waiting room. Chaplain sat in consult room with father until she was told to bring him to the room. Father told staff Pt has a 71 yr old son.  While in Pt's room was told others were in the waiting room. Chaplain brought Pt's boyfriend and dad's girlfriend to the family consult room. At NP talked with father she asked if it was ok for daughter's boyfriend to come back and he said yes. Chaplain escorted both to Pt's room. Chaplain stayed in the room until everyone left the room. Pt went to CT and than was transported to ICU. Chaplain went to ICU to check on Pt and her family was in the waiting room. Father and his friend asked for coffee and chaplain brought coffee to them. Pt's father asked chaplain to pray for his daughter and she told him that she wouldWaiting is waiting for Pt's mother to get here from [place] before any decisions are made. Chaplain asked unit secretary to page her once Pt's mother arrived. While charting chaplain was paged, secretary said Pt's mother was on her way up at 3:59. Chaplain sat outside the room until NP came back to talk to Pt's parents. Chaplain remained with the family supporting them. The father asked chaplain what should they do and she told him it was solely their decision, but the did talk about possibility of Pt suffering. After NP talked with parents again, they decided to take her off of the ventilator. NP asked parents to wait in the waiting room and shortly thereafter she came out to get them. When they reentered the room Pt's father asked chaplain to pray and she did.Chaplain escorted them to Pt's room and she died  within less than twenty minutes, Pt died at 5:23. Chaplain stayed with them until they were ready to leave. After funeral release was fill out, chaplain walked with them to the waiting area to meet up with their partners. They all talked for a few minutes and than chaplain escorted them to emergency area because their cars where parked in that parking lot. Chaplain's visited ended at 5:52.

## 2020-11-05 NOTE — ED Provider Notes (Signed)
[facility] Emergency Department Provider Note   ____________________________________________   Event Date/Time   First MD Initiated Contact with Patient 20-Jan-2020 [phone]     (approximate)  I have reviewed the triage vital signs and the nursing notes.   HISTORY  Chief Complaint CPR   HPI Dana Bradshaw is a 30 y.o. female brought to the ED via EMS, CPR in progress.  Patient with a history of anxiety/depression, seizures, polysubstance abuse who was found unresponsive by her fianc.  Last seen at 9 PM.  EMS reports asystole upon their arrival.  Patient was given Narcan, 8 rounds of epi via IO, CPR x50 minutes.  Achieved ROSC and brought to the ED.     Past Medical History:  Diagnosis Date  . Anxiety   . Depression   . Seizures [facility](HCC)     Patient Active Problem List   Diagnosis Date Noted  . Cardiac arrest (HCC) 2020-02-10  . 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 Polysubstance dependence (HCC) 08/23/2016    No past surgical history on file.  Prior to Admission medications   Medication Sig Start Date End Date Taking? Authorizing Provider  citalopram (CELEXA) 20 MG tablet Take 20 mg by mouth daily.    [provider]  hydrOXYzine (ATARAX/VISTARIL) 25 MG tablet Take 25 mg by mouth 4 (four) times daily as needed.    [provider]    Allergies Penicillins  No family history on file.  Social History Social History   Tobacco Use  . Smoking status: Current Every Day Smoker    Packs/day: 1.00  . Smokeless tobacco: Never Used  Substance Use Topics  . Alcohol use: Yes  . Drug use: Yes    Types: Methamphetamines    Comment: Last use "on my own free will" was 6 weeks ago    Review of Systems  Constitutional: No fever/chills Eyes: No visual changes. ENT: No sore throat. Cardiovascular: Denies chest pain. Respiratory: Denies shortness of breath. Gastrointestinal: No abdominal  pain.  No nausea, no vomiting.  No diarrhea.  No constipation. Genitourinary: Negative for dysuria. Musculoskeletal: Negative for back pain. Skin: Negative for rash. Neurological: Positive for unresponsive.  Negative for headaches, focal weakness or numbness. 10 Point review of systems limited secondary to unresponsiveness.  ____________________________________________   PHYSICAL EXAM:  VITAL SIGNS: ED Triage Vitals  Enc Vitals Group     BP      Pulse      Resp      Temp      Temp src      SpO2      Weight      Height      Head Circumference      Peak Flow      Pain Score      Pain Loc      Pain Edu?      Excl. in GC?     Constitutional: Unresponsive.   Eyes: Pupils fixed and dilated. Head: Atraumatic. Nose: Atraumatic. Mouth/Throat: Mucous membranes are dry.   Neck: No stridor.   Cardiovascular: No pulse.  Chest compressions.   Respiratory: King airway in place. Gastrointestinal: Soft. No distention.  Musculoskeletal: No lower extremity edema.  LLE IO. Neurologic: Unresponsive.   Skin:  Skin is cool, dry and intact. No rash noted. Psychiatric: Unable to assess. ____________________________________________   LABS (all labs ordered are listed, but only abnormal results are displayed)  Labs Reviewed  CBC WITH DIFFERENTIAL/PLATELET - Abnormal;  Notable for the following components:      Result Value   WBC 33.5 (*)    RBC 3.73 (*)    MCV 109.4 (*)    MCH 34.3 (*)    nRBC 0.4 (*)    Neutro Abs 9.6 (*)    Lymphs Abs 19.9 (*)    Basophils Absolute 0.2 (*)    Abs Immature Granulocytes 2.57 (*)    All other components within normal limits  COMPREHENSIVE METABOLIC PANEL - Abnormal; Notable for the following components:   Potassium 6.2 (*)    CO2 17 (*)    Glucose, Bld 335 (*)    Creatinine, Ser 1.32 (*)    Calcium 8.7 (*)    Total Protein 5.6 (*)    Albumin 2.8 (*)    AST 2,069 (*)    ALT 2,336 (*)    Alkaline Phosphatase 129 (*)    GFR, Estimated 56 (*)     Anion gap 23 (*)    All other components within normal limits  ACETAMINOPHEN LEVEL - Abnormal; Notable for the following components:   Acetaminophen (Tylenol), Serum <10 (*)    All other components within normal limits  ETHANOL - Abnormal; Notable for the following components:   Alcohol, Ethyl (B) 12 (*)    All other components within normal limits  GLUCOSE, CAPILLARY - Abnormal; Notable for the following components:   Glucose-Capillary 214 (*)    All other components within normal limits  TROPONIN I (HIGH SENSITIVITY) - Abnormal; Notable for the following components:   Troponin I (High Sensitivity) 536 (*)    All other components within normal limits  RESP PANEL BY RT-PCR (FLU A&B, COVID) ARPGX2  MRSA PCR SCREENING  LACTIC ACID, PLASMA  LACTIC ACID, PLASMA  URINALYSIS, COMPLETE (UACMP) WITH MICROSCOPIC  URINE DRUG SCREEN, QUALITATIVE ([facility] ONLY)  BLOOD GAS, ARTERIAL  HIV ANTIBODY (ROUTINE TESTING W REFLEX)  SALICYLATE LEVEL  PROCALCITONIN  DRUG SCREEN 10 W/CONF, SERUM  POC URINE PREG, ED  TROPONIN I (HIGH SENSITIVITY)   ____________________________________________  EKG  ED ECG REPORT I, Simcha Speir J, the attending physician, personally viewed and interpreted this ECG.   Date: 2020-11-05  EKG Time: 0209  Rate: 78  Rhythm: normal EKG, normal sinus rhythm  Axis: Normal  Intervals:QTC 535  ST&T Change: Nonspecific  ____________________________________________  RADIOLOGY I, Herndon Grill J, personally viewed and evaluated these images (plain radiographs) as part of my medical decision making, as well as reviewing the written report by the radiologist.  ED MD interpretation: ET tube in the trachea, will retract 2cm  Official radiology report(s): CT HEAD WO CONTRAST  Result Date: 2020/11/05 CLINICAL DATA:  Initial evaluation for acute unresponsiveness, found down, status post CPR. EXAM: CT HEAD WITHOUT CONTRAST TECHNIQUE: Contiguous axial images were obtained from the  base of the skull through the vertex without intravenous contrast. COMPARISON:  Prior head CT from 02/22/2019. FINDINGS: Brain: There is diffuse loss of gray-white matter differentiation and cortical sulcation, consistent with global cerebral edema and concerning for widespread anoxic injury. Basilar cisterns are effaced. Ventricular system is small and slit-like. Cerebellar tonsils are somewhat low lying as compared to previous, concerning for impending transtentorial herniation (series 5, image 26). No acute intracranial hemorrhage. No visible acute vascular territory infarct. No mass lesion or midline shift. No hydrocephalus or extra-axial collection. Vascular: Intracranial vasculature diffusely hyperdense related to adjacent parenchymal hypodensity. Skull: No scalp soft tissue abnormality.  Calvarium intact. Sinuses/Orbits: Visualized globes orbital soft tissues within normal limits. Visualized paranasal  sinuses are largely clear. No mastoid effusion. Other: None. IMPRESSION: Diffuse loss of gray-white matter differentiation and cortical sulcation, consistent with global cerebral edema and concerning for widespread anoxic brain injury. Cerebellar tonsils are somewhat low lying as compared to previous, concerning for impending transtentorial herniation. Critical Value/emergent results were called by telephone at the time of interpretation on 11/13/2020 at 3:53 am to provider BRITTON RUST-CHESTER , who verbally acknowledged these results. Electronically Signed   By: Rise Mu M.D.   On: 11-13-2020 03:54   DG Chest Port 1 View  Result Date: 11/13/20 CLINICAL DATA:  CPR, ETT, OG placement EXAM: PORTABLE CHEST 1 VIEW COMPARISON:  Radiograph 08/01/2014 FINDINGS: Endotracheal tube tip terminates in the lower trachea, 1.2 cm from the carina, consider retraction 2-3 cm to the mid trachea. Transesophageal tube tip terminates in left upper quadrant but with the side port at the GE junction and should be  advanced 3-5 cm for optimal functioning. Telemetry leads and external support devices overlie the chest, repositioned on the second image. Low volumes and atelectasis. More hazy opacities are present in the perihilar region. No pneumothorax. No effusion. Cardiomediastinal contours unremarkable for the portable technique. No visible displaced rib fractures. Likely postsurgical resection of the distal head left clavicle. No acute osseous or soft tissue abnormality. IMPRESSION: 1. Endotracheal tube tip terminates in the lower trachea, 1.2 cm from the carina, consider retraction 2-3 cm to the mid trachea. 2. Transesophageal tube tip in the left upper quadrant but with the side port at the GE junction and should be advanced 3-5 cm for optimal functioning. 3. Low volumes and atelectasis with more hazy opacities in the perihilar region possibly edematous change in the setting of pulmonary resuscitation though pulmonary contusion or airspace disease is not excluded. Electronically Signed   By: Kreg Shropshire M.D.   On: 11-13-20 02:09    ____________________________________________   PROCEDURES  Procedure(s) performed (including Critical Care):  Procedure Name: Intubation Date/Time: November 13, 2020 2:09 AM Performed by: Irean Hong, MD Pre-anesthesia Checklist: Patient identified, Emergency Drugs available, Suction available and Patient being monitored Preoxygenation: Pre-oxygenation with 100% oxygen Induction Type: IV induction and Rapid sequence Laryngoscope Size: Glidescope and 3 Grade View: Grade II Tube size: 7.5 mm Number of attempts: 1 Airway Equipment and Method: Rigid stylet Placement Confirmation: ETT inserted through vocal cords under direct vision,  CO2 detector and Breath sounds checked- equal and bilateral Dental Injury: Teeth and Oropharynx as per pre-operative assessment     .1-3 Lead EKG Interpretation Performed by: Irean Hong, MD Authorized by: Irean Hong, MD      Interpretation: normal     ECG rate:  78   ECG rate assessment: normal     Rhythm: sinus rhythm     Ectopy: none     Conduction: normal   Comments:     Patient placed on cardiac monitor to evaluate for arrhythmias    CRITICAL CARE Performed by: Irean Hong   Total critical care time: 60 minutes  Critical care time was exclusive of separately billable procedures and treating other patients.  Critical care was necessary to treat or prevent imminent or life-threatening deterioration.  Critical care was time spent personally by me on the following activities: development of treatment plan with patient and/or surrogate as well as nursing, discussions with consultants, evaluation of patient's response to treatment, examination of patient, obtaining history from patient or surrogate, ordering and performing treatments and interventions, ordering and review of laboratory studies, ordering and review of  radiographic studies, pulse oximetry and re-evaluation of patient's condition.  ____________________________________________   INITIAL IMPRESSION / ASSESSMENT AND PLAN / ED COURSE  As part of my medical decision making, I reviewed the following data within the electronic MEDICAL RECORD NUMBER History obtained from family, Nursing notes reviewed and incorporated, Labs reviewed, EKG interpreted, Old chart reviewed, Discussed with admitting physician and Notes from prior ED visits     30 year old female found unresponsive from presumed overdose.  Unknown total downtime.  CPR time 50 minutes prior to arrival with numerous doses of epi, 1 dose of bicarb, 300 mg bolus amiodarone and 1 defibrillation.  Shortly after intubation patient lost pulses.  3 rounds of CPR and resuscitative medicines given with ROSC.  Clinical Course as of Oct 31, 2020 0435  Mon 2020/10/31  0137 ROSC achieved.  Patient too unstable for CT head.  Will discuss with CCU NP for admission. [JS]  0146 Patient became bradycardic and  lost pulses.  CPR resumed.  Epi and atropine given with return of spontaneous circulation. [JS]  0206 Discussed with patient's father Dana Bradshaw who is at bedside.  Also discussed with patient's mother via telephone who is driving from [place].  Updated them on patient's very grim prognosis and extremely low chance for recovery and encouraged any family members to come see her if they wish to. [JS]    Clinical Course User Index [JS] Irean Hong, MD     ____________________________________________   FINAL CLINICAL IMPRESSION(S) / ED DIAGNOSES  Final diagnoses:  Cardiopulmonary arrest [facility])     ED Discharge Orders    None      *Please note:  Dana Bradshaw was evaluated in Emergency Department on Oct 31, 2020 for the symptoms described in the history of present illness. She was evaluated in the context of the global COVID-19 pandemic, which necessitated consideration that the patient might be at risk for infection with the SARS-CoV-2 virus that causes COVID-19. Institutional protocols and algorithms that pertain to the evaluation of patients at risk for COVID-19 are in a state of rapid change based on information released by regulatory bodies including the CDC and federal and state organizations. These policies and algorithms were followed during the patient's care in the ED.  Some ED evaluations and interventions may be delayed as a result of limited staffing during and the pandemic.*   Note:  This document was prepared using Dragon voice recognition software and may include unintentional dictation errors.   Irean Hong, MD 2020-10-31 [phone]

## 2020-11-05 NOTE — Death Summary Note (Signed)
DEATH SUMMARY   Patient Details  Name: Dana Bradshaw MRN: [id number] DOB: [birth year]  Admission/Discharge Information   Admit Date:  11/19/2020  Date of Death: Date of Death: 11/19/2020  Time of Death: Time of Death: (P) 0523  Length of Stay: 0  Referring Physician: Patient, No Pcp Per   Reason(s) for Hospitalization  Unresponsive status post cardiac arrest requiring intubation and mechanical ventilation  Diagnoses  Preliminary cause of death:  Secondary Diagnoses (including complications and co-morbidities):  Active Problems:   Cardiac arrest [facility])   Brief Hospital Course (including significant findings, care, treatment, and services provided and events leading to death)  Dana Bradshaw is a 30 y.o. year old female who has a known history of polysubstance abuse was found unresponsive at home by significant other around 11 PM, last known well was 9 PM.  CPR was initiated and EMS was called.  EMS found the patient in asystole and performed CPR for 50 minutes, giving 8 rounds of epinephrine before achieving ROSC.  Patient was intubated upon arrival to the ED, and then had 2 more episodes of pulselessness requiring CPR and epinephrine administration before achieving ROSC.  Patient remained unresponsive status post cardiac arrest.  Head CT showed a diffuse loss of gray-white matter and cortical sulcation consistent with global cerebral edema concerning for widespread anoxic brain injury as well as concerns for impending transtentorial herniation.  Patient was also hyperkalemic, with an acute GI bleed requiring multiple vasopressors.  Family was consulted regarding goals of care and CODE STATUS and was transition to comfort measures.  Patient was terminally extubated, time of death 5:23.  Pertinent Labs and Studies  Significant Diagnostic Studies CT HEAD WO CONTRAST  Result Date: 11-19-20 CLINICAL DATA:  Initial evaluation for acute unresponsiveness, found down, status post CPR. EXAM: CT  HEAD WITHOUT CONTRAST TECHNIQUE: Contiguous axial images were obtained from the base of the skull through the vertex without intravenous contrast. COMPARISON:  Prior head CT from 02/22/2019. FINDINGS: Brain: There is diffuse loss of gray-white matter differentiation and cortical sulcation, consistent with global cerebral edema and concerning for widespread anoxic injury. Basilar cisterns are effaced. Ventricular system is small and slit-like. Cerebellar tonsils are somewhat low lying as compared to previous, concerning for impending transtentorial herniation (series 5, image 26). No acute intracranial hemorrhage. No visible acute vascular territory infarct. No mass lesion or midline shift. No hydrocephalus or extra-axial collection. Vascular: Intracranial vasculature diffusely hyperdense related to adjacent parenchymal hypodensity. Skull: No scalp soft tissue abnormality.  Calvarium intact. Sinuses/Orbits: Visualized globes orbital soft tissues within normal limits. Visualized paranasal sinuses are largely clear. No mastoid effusion. Other: None. IMPRESSION: Diffuse loss of gray-white matter differentiation and cortical sulcation, consistent with global cerebral edema and concerning for widespread anoxic brain injury. Cerebellar tonsils are somewhat low lying as compared to previous, concerning for impending transtentorial herniation. Critical Value/emergent results were called by telephone at the time of interpretation on 2020/11/19 at 3:53 am to provider Jalei Shibley RUST-CHESTER , who verbally acknowledged these results. Electronically Signed   By: Rise Mu M.D.   On: 19-Nov-2020 03:54   DG Chest Port 1 View  Result Date: 2020/11/19 CLINICAL DATA:  CPR, ETT, OG placement EXAM: PORTABLE CHEST 1 VIEW COMPARISON:  Radiograph 08/01/2014 FINDINGS: Endotracheal tube tip terminates in the lower trachea, 1.2 cm from the carina, consider retraction 2-3 cm to the mid trachea. Transesophageal tube tip terminates  in left upper quadrant but with the side port at the GE junction and should be  advanced 3-5 cm for optimal functioning. Telemetry leads and external support devices overlie the chest, repositioned on the second image. Low volumes and atelectasis. More hazy opacities are present in the perihilar region. No pneumothorax. No effusion. Cardiomediastinal contours unremarkable for the portable technique. No visible displaced rib fractures. Likely postsurgical resection of the distal head left clavicle. No acute osseous or soft tissue abnormality. IMPRESSION: 1. Endotracheal tube tip terminates in the lower trachea, 1.2 cm from the carina, consider retraction 2-3 cm to the mid trachea. 2. Transesophageal tube tip in the left upper quadrant but with the side port at the GE junction and should be advanced 3-5 cm for optimal functioning. 3. Low volumes and atelectasis with more hazy opacities in the perihilar region possibly edematous change in the setting of pulmonary resuscitation though pulmonary contusion or airspace disease is not excluded. Electronically Signed   By: Kreg Shropshire M.D.   On: November 14, 2020 02:09    Microbiology Recent Results (from the past 240 hour(s))  MRSA PCR Screening     Status: Abnormal   Collection Time: 11-14-20  3:30 AM   Specimen: Nasopharyngeal  Result Value Ref Range Status   MRSA by PCR POSITIVE (A) NEGATIVE Final    Comment:        The GeneXpert MRSA Assay (FDA approved for NASAL specimens only), is one component of a comprehensive MRSA colonization surveillance program. It is not intended to diagnose MRSA infection nor to guide or monitor treatment for MRSA infections. RESULT CALLED TO, READ BACK BY AND VERIFIED WITH:  CHRISTINE SAUL AT 0540 11-14-20 SDR Performed at [facility], [address] [place], [place] [address]   Resp Panel by RT-PCR (Flu A&B, Covid)     Status: None   Collection Time: 11/14/2020  3:45 AM   Specimen: Nasopharyngeal(NP) swabs in  vial transport medium  Result Value Ref Range Status   SARS Coronavirus 2 by RT PCR NEGATIVE NEGATIVE Final    Comment: (NOTE) SARS-CoV-2 target nucleic acids are NOT DETECTED.  The SARS-CoV-2 RNA is generally detectable in upper respiratory specimens during the acute phase of infection. The lowest concentration of SARS-CoV-2 viral copies this assay can detect is 138 copies/mL. A negative result does not preclude SARS-Cov-2 infection and should not be used as the sole basis for treatment or other patient management decisions. A negative result may occur with  improper specimen collection/handling, submission of specimen other than nasopharyngeal swab, presence of viral mutation(s) within the areas targeted by this assay, and inadequate number of viral copies(<138 copies/mL). A negative result must be combined with clinical observations, patient history, and epidemiological information. The expected result is Negative.  Fact Sheet for Patients:  BloggerCourse.com  Fact Sheet for Healthcare Providers:  SeriousBroker.it  This test is no t yet approved or cleared by the [place] FDA and  has been authorized for detection and/or diagnosis of SARS-CoV-2 by FDA under an Emergency Use Authorization (EUA). This EUA will remain  in effect (meaning this test can be used) for the duration of the COVID-19 declaration under Section 564(b)(1) of the Act, 21 U.S.C.section 360bbb-3(b)(1), unless the authorization is terminated  or revoked sooner.       Influenza A by PCR NEGATIVE NEGATIVE Final   Influenza B by PCR NEGATIVE NEGATIVE Final    Comment: (NOTE) The Xpert Xpress SARS-CoV-2/FLU/RSV plus assay is intended as an aid in the diagnosis of influenza from Nasopharyngeal swab specimens and should not be used as a sole basis for treatment.  Nasal washings and aspirates are unacceptable for Xpert Xpress SARS-CoV-2/FLU/RSV testing.  Fact  Sheet for Patients: BloggerCourse.com  Fact Sheet for Healthcare Providers: SeriousBroker.it  This test is not yet approved or cleared by the [place] FDA and has been authorized for detection and/or diagnosis of SARS-CoV-2 by FDA under an Emergency Use Authorization (EUA). This EUA will remain in effect (meaning this test can be used) for the duration of the COVID-19 declaration under Section 564(b)(1) of the Act, 21 U.S.C. section 360bbb-3(b)(1), unless the authorization is terminated or revoked.  Performed at [facility], [address] [place], [place] [address]     Lab Basic Metabolic Panel: Recent Labs  Lab 11-14-20 0207  NA 138  K 6.2*  CL 98  CO2 17*  GLUCOSE 335*  BUN 17  CREATININE 1.32*  CALCIUM 8.7*   Liver Function Tests: Recent Labs  Lab November 14, 2020 0207  AST 2,069*  ALT 2,336*  ALKPHOS 129*  BILITOT 0.9  PROT 5.6*  ALBUMIN 2.8*   No results for input(s): LIPASE, AMYLASE in the last 168 hours. No results for input(s): AMMONIA in the last 168 hours. CBC: Recent Labs  Lab 2020/11/14 0207  WBC 33.5*  NEUTROABS 9.6*  HGB 12.8  HCT 40.8  MCV 109.4*  PLT 202   Cardiac Enzymes: No results for input(s): CKTOTAL, CKMB, CKMBINDEX, TROPONINI in the last 168 hours. Sepsis Labs: Recent Labs  Lab 11/14/2020 0207 14-Nov-2020 0403  PROCALCITON  --  2.14  WBC 33.5*  --   LATICACIDVEN  --  9.3*    Procedures/Operations  14-Nov-2020 ETT   Cheryll Cockayne Rust-Chester, AGACNP-BC Acute Care Nurse Practitioner [facility]   [phone] / [phone] Please see Amion for pager details.     Cindi Ghazarian L Rust-Chester 11-14-20, 8:16 AM

## 2020-11-05 DEATH — deceased

## 2022-04-13 IMAGING — CT CT HEAD W/O CM
3 series · 15 of 46 positions shown, 18 images · non-contrast
Comparison: Prior head CT from 02/22/2019.

CLINICAL DATA: Initial evaluation for acute unresponsiveness, found
down, status post CPR.

EXAM:
CT HEAD WITHOUT CONTRAST
TECHNIQUE: Contiguous axial images were obtained from the base of the skull
through the vertex without intravenous contrast.

[Series 3: head wo · axial · 0.41mm/px · z∈[-188,-68]mm · 9 of 29 slices shown, 12 images]
[im 3/29  brain]
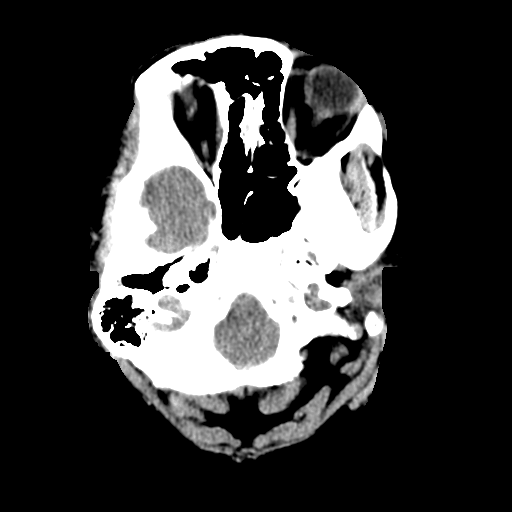
[im 3/29  bone]
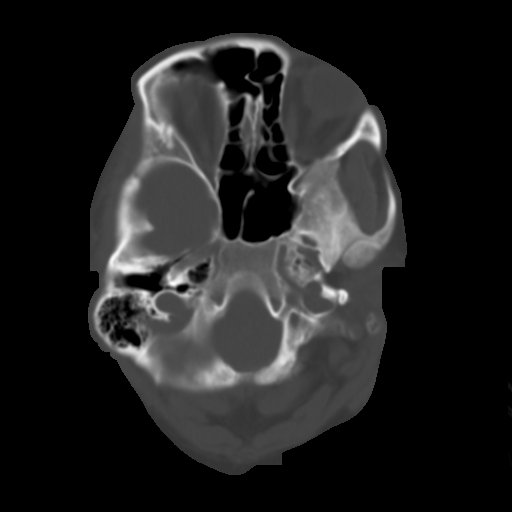
[im 6/29  brain]
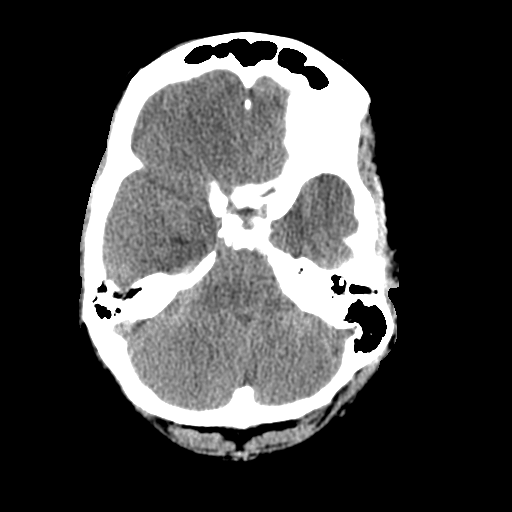
[im 9/29  brain]
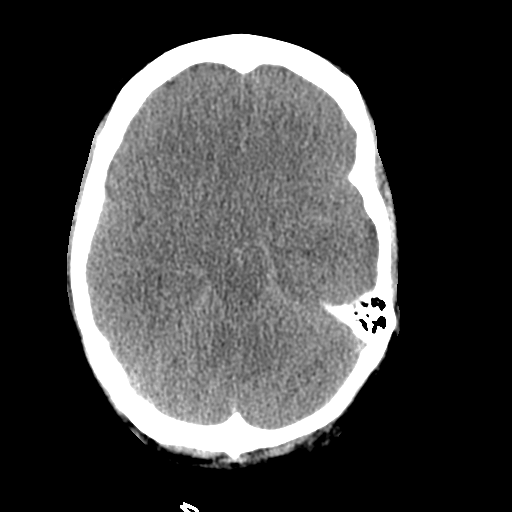
[im 12/29  brain]
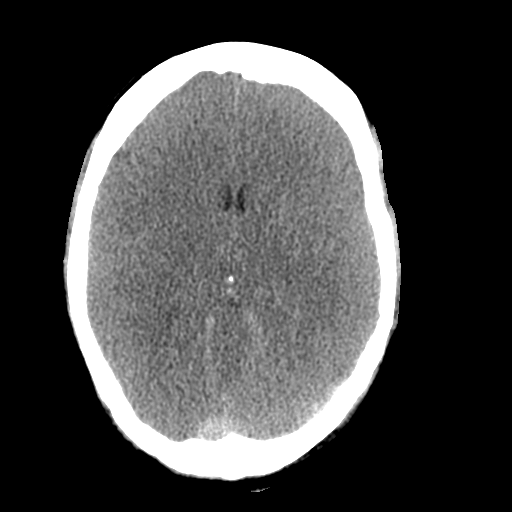
[im 15/29  brain]
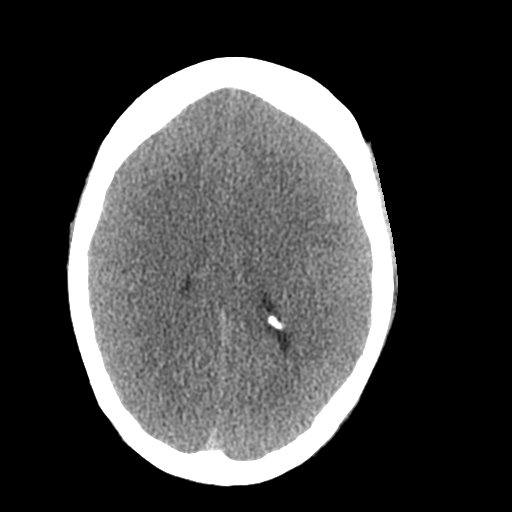
[im 15/29  bone]
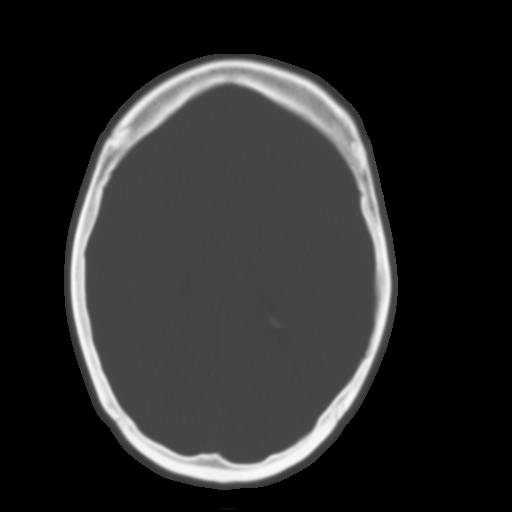
[im 18/29  brain]
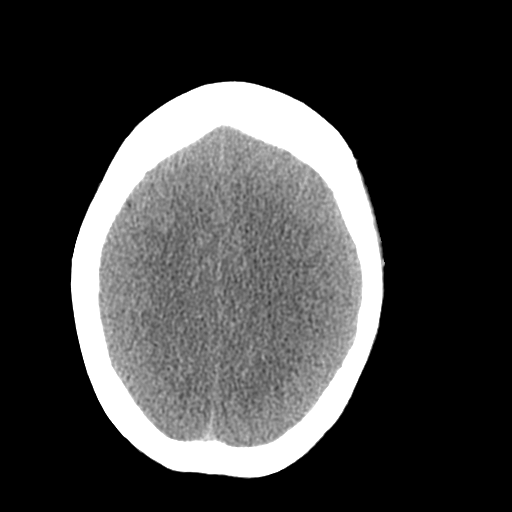
[im 21/29  brain]
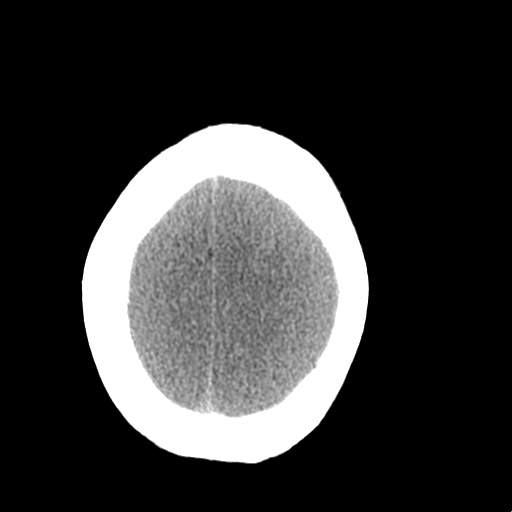
[im 24/29  brain]
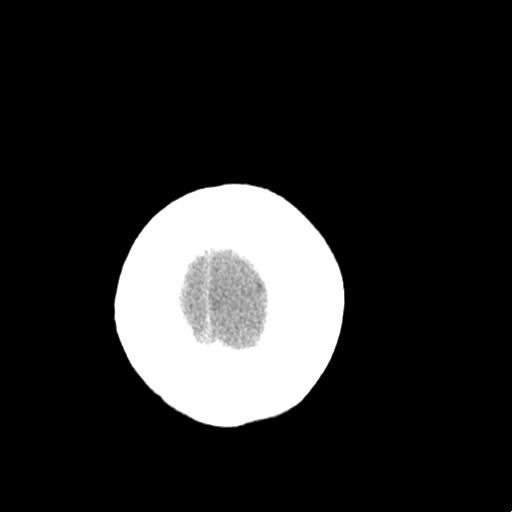
[im 27/29  brain]
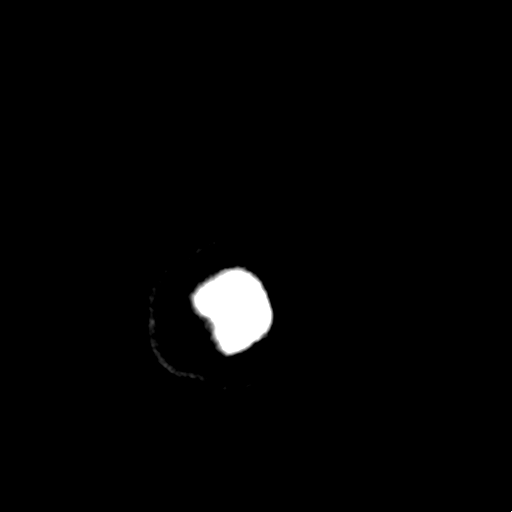
[im 27/29  bone]
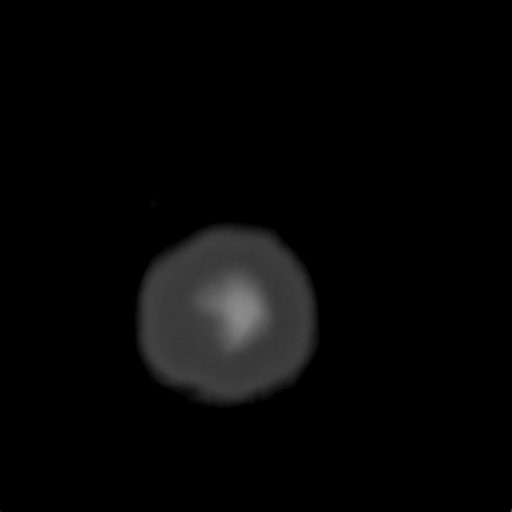

[Series 4: coronal soft tissue · coronal · 0.27mm/px · 3 of 63 slices shown]
[im 21/63  brain]
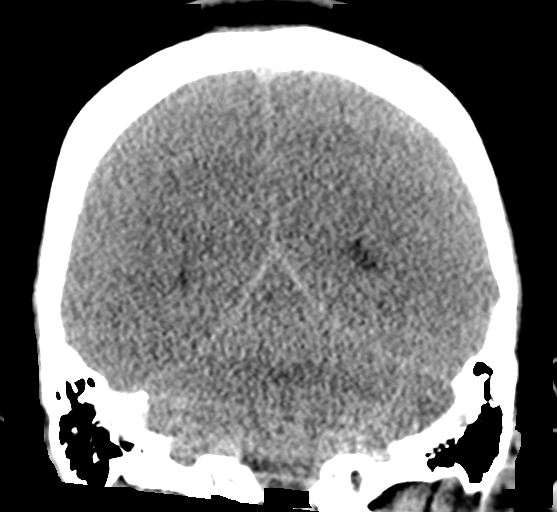
[im 28/63  brain]
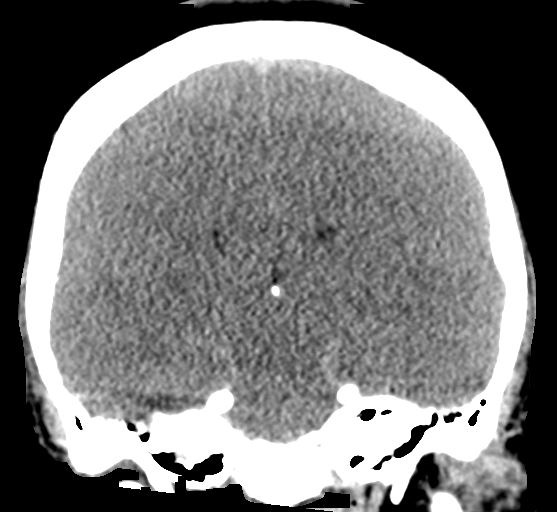
[im 35/63  brain]
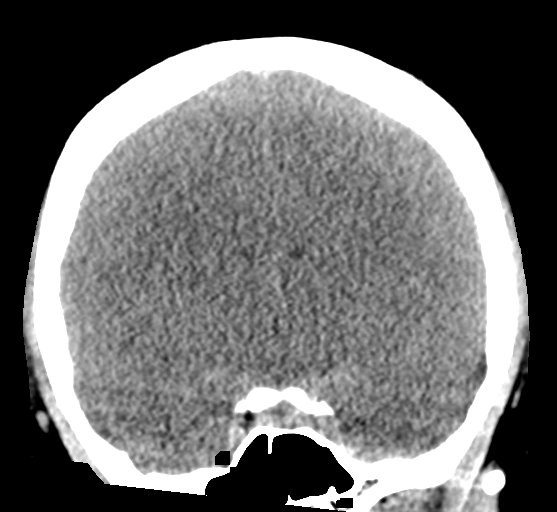

[Series 5: sagittal soft tissue · sagittal · 0.27mm/px · 3 of 50 slices shown]
[im 17/50  brain]
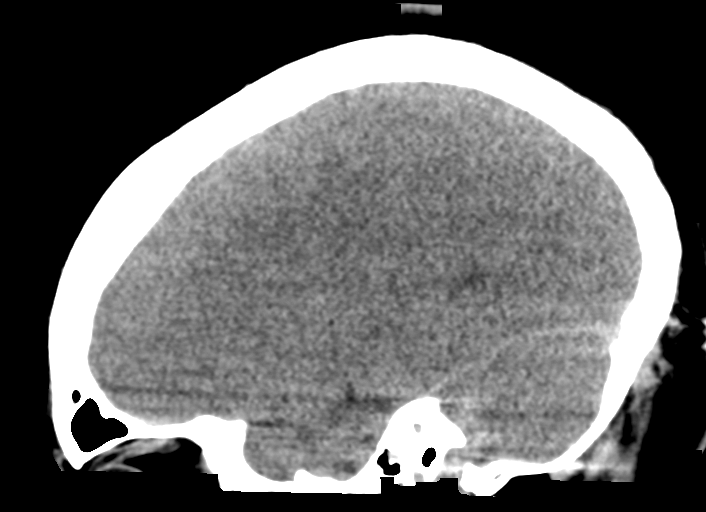
[im 25/50  brain]
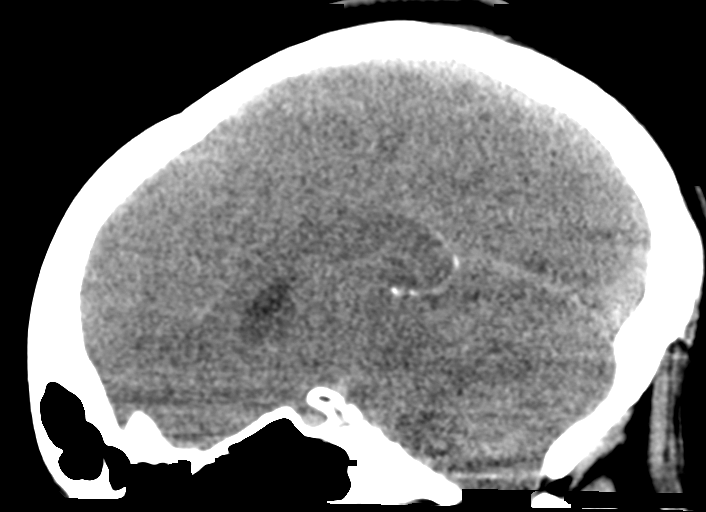
[im 33/50  brain]
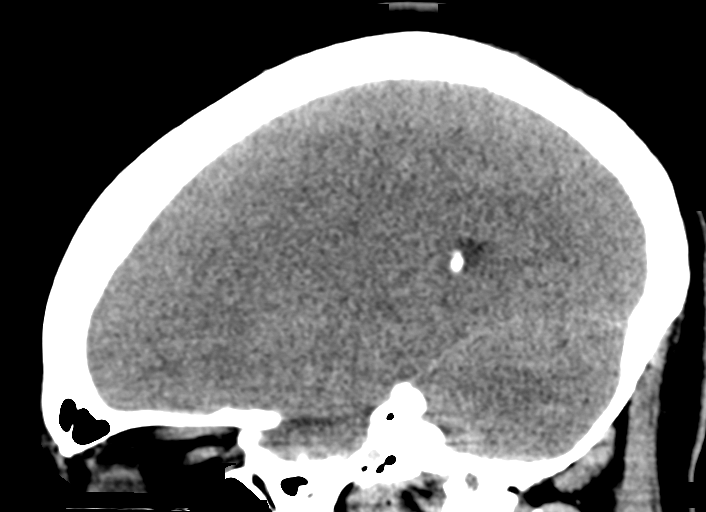

[15 of 46 positions shown; findings below may reference images not displayed]

FINDINGS: Brain: There is diffuse loss of gray-white matter differentiation
and cortical sulcation, consistent with global cerebral edema and
concerning for widespread anoxic injury. Basilar cisterns are
effaced. Ventricular system is small and slit-like. Cerebellar
tonsils are somewhat low lying as compared to previous, concerning
for impending transtentorial herniation (series 5, image 26).

No acute intracranial hemorrhage. No visible acute vascular
territory infarct. No mass lesion or midline shift. No hydrocephalus
or extra-axial collection.

Vascular: Intracranial vasculature diffusely hyperdense related to
adjacent parenchymal hypodensity.

Skull: No scalp soft tissue abnormality.  Calvarium intact.

Sinuses/Orbits: Visualized globes orbital soft tissues within normal
limits. Visualized paranasal sinuses are largely clear. No mastoid
effusion.

Other: None.
IMPRESSION: Diffuse loss of gray-white matter differentiation and cortical
sulcation, consistent with global cerebral edema and concerning for
widespread anoxic brain injury. Cerebellar tonsils are somewhat low
lying as compared to previous, concerning for impending
transtentorial herniation.

Critical Value/emergent results were called by telephone at the time
of interpretation on 10/24/2020 at [DATE] to provider TIGER
SHARANYA , who verbally acknowledged these results.
